# Patient Record
Sex: Female | Born: 1980 | Race: Black or African American | Hispanic: No | Marital: Single | Smoking: Never smoker
Health system: Southern US, Community
[De-identification: ages and names within clinical notes are randomized; demographics above are authoritative.]

## PROBLEM LIST (undated history)

## (undated) ENCOUNTER — Inpatient Hospital Stay (HOSPITAL_COMMUNITY): Payer: Self-pay

## (undated) DIAGNOSIS — D219 Benign neoplasm of connective and other soft tissue, unspecified: Secondary | ICD-10-CM

## (undated) DIAGNOSIS — K589 Irritable bowel syndrome without diarrhea: Secondary | ICD-10-CM

## (undated) DIAGNOSIS — A749 Chlamydial infection, unspecified: Secondary | ICD-10-CM

## (undated) DIAGNOSIS — D649 Anemia, unspecified: Secondary | ICD-10-CM

## (undated) DIAGNOSIS — O24419 Gestational diabetes mellitus in pregnancy, unspecified control: Secondary | ICD-10-CM

---
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
## 2006-12-20 ENCOUNTER — Ambulatory Visit: Payer: Self-pay | Admitting: Internal Medicine

## 2006-12-20 LAB — CONVERTED CEMR LAB
AST: 22 units/L (ref 0–37)
Albumin: 4.1 g/dL (ref 3.5–5.2)
Alkaline Phosphatase: 47 units/L (ref 39–117)
Basophils Absolute: 0 10*3/uL (ref 0.0–0.1)
Basophils Relative: 0.6 % (ref 0.0–1.0)
Bilirubin, Direct: 0.1 mg/dL (ref 0.0–0.3)
Calcium: 9.3 mg/dL (ref 8.4–10.5)
Chloride: 103 meq/L (ref 96–112)
Eosinophils Absolute: 0.1 10*3/uL (ref 0.0–0.6)
GFR calc Af Amer: 113 mL/min
GFR calc non Af Amer: 94 mL/min
HCT: 35.8 % — ABNORMAL LOW (ref 36.0–46.0)
Lymphocytes Relative: 31.3 % (ref 12.0–46.0)
MCHC: 34.1 g/dL (ref 30.0–36.0)
Monocytes Absolute: 0.2 10*3/uL (ref 0.2–0.7)
Monocytes Relative: 3.1 % (ref 3.0–11.0)
Neutro Abs: 4.9 10*3/uL (ref 1.4–7.7)
Neutrophils Relative %: 64.3 % (ref 43.0–77.0)
Platelets: 417 10*3/uL — ABNORMAL HIGH (ref 150–400)
RDW: 12.8 % (ref 11.5–14.6)
Sodium: 140 meq/L (ref 135–145)

## 2007-01-02 ENCOUNTER — Ambulatory Visit: Payer: Self-pay | Admitting: Gastroenterology

## 2007-01-10 ENCOUNTER — Ambulatory Visit (HOSPITAL_COMMUNITY): Admission: RE | Admit: 2007-01-10 | Discharge: 2007-01-10 | Payer: Self-pay | Admitting: Gastroenterology

## 2007-01-15 ENCOUNTER — Ambulatory Visit: Payer: Self-pay | Admitting: Gastroenterology

## 2007-01-29 ENCOUNTER — Ambulatory Visit: Payer: Self-pay | Admitting: Gastroenterology

## 2007-06-25 ENCOUNTER — Ambulatory Visit: Payer: Self-pay | Admitting: Gastroenterology

## 2007-06-25 LAB — CONVERTED CEMR LAB
Albumin: 3.8 g/dL (ref 3.5–5.2)
Alkaline Phosphatase: 41 units/L (ref 39–117)
Basophils Absolute: 0 10*3/uL (ref 0.0–0.1)
Basophils Relative: 0.2 % (ref 0.0–1.0)
Bilirubin, Direct: 0.1 mg/dL (ref 0.0–0.3)
CO2: 31 meq/L (ref 19–32)
Calcium: 8.9 mg/dL (ref 8.4–10.5)
Chloride: 106 meq/L (ref 96–112)
Eosinophils Absolute: 0 10*3/uL (ref 0.0–0.6)
Eosinophils Relative: 0.5 % (ref 0.0–5.0)
GFR calc Af Amer: 98 mL/min
Glucose, Bld: 123 mg/dL — ABNORMAL HIGH (ref 70–99)
MCV: 80.5 fL (ref 78.0–100.0)
Neutrophils Relative %: 65.1 % (ref 43.0–77.0)
RDW: 12.8 % (ref 11.5–14.6)
Sodium: 141 meq/L (ref 135–145)
Total Bilirubin: 0.7 mg/dL (ref 0.3–1.2)
Total Protein: 6.9 g/dL (ref 6.0–8.3)

## 2007-06-26 ENCOUNTER — Ambulatory Visit: Payer: Self-pay | Admitting: Gastroenterology

## 2007-07-09 ENCOUNTER — Encounter: Payer: Self-pay | Admitting: Internal Medicine

## 2007-07-09 ENCOUNTER — Ambulatory Visit: Payer: Self-pay | Admitting: Gastroenterology

## 2007-07-10 ENCOUNTER — Ambulatory Visit (HOSPITAL_COMMUNITY): Admission: RE | Admit: 2007-07-10 | Discharge: 2007-07-10 | Payer: Self-pay | Admitting: Gastroenterology

## 2007-10-13 ENCOUNTER — Inpatient Hospital Stay (HOSPITAL_COMMUNITY): Admission: AD | Admit: 2007-10-13 | Discharge: 2007-10-13 | Payer: Self-pay | Admitting: Obstetrics & Gynecology

## 2007-10-16 ENCOUNTER — Inpatient Hospital Stay (HOSPITAL_COMMUNITY): Admission: AD | Admit: 2007-10-16 | Discharge: 2007-10-16 | Payer: Self-pay | Admitting: Obstetrics & Gynecology

## 2007-10-18 ENCOUNTER — Ambulatory Visit: Payer: Self-pay | Admitting: Internal Medicine

## 2007-11-07 DIAGNOSIS — A749 Chlamydial infection, unspecified: Secondary | ICD-10-CM

## 2007-11-07 HISTORY — DX: Chlamydial infection, unspecified: A74.9

## 2007-11-13 ENCOUNTER — Encounter: Payer: Self-pay | Admitting: Internal Medicine

## 2007-11-15 ENCOUNTER — Ambulatory Visit: Payer: Self-pay | Admitting: Obstetrics & Gynecology

## 2007-11-15 ENCOUNTER — Encounter: Payer: Self-pay | Admitting: Obstetrics and Gynecology

## 2007-12-13 ENCOUNTER — Encounter: Payer: Self-pay | Admitting: Internal Medicine

## 2008-02-05 ENCOUNTER — Telehealth (INDEPENDENT_AMBULATORY_CARE_PROVIDER_SITE_OTHER): Payer: Self-pay | Admitting: *Deleted

## 2008-02-05 ENCOUNTER — Ambulatory Visit: Payer: Self-pay | Admitting: Internal Medicine

## 2008-02-05 DIAGNOSIS — R109 Unspecified abdominal pain: Secondary | ICD-10-CM | POA: Insufficient documentation

## 2008-02-05 LAB — CONVERTED CEMR LAB
Blood in Urine, dipstick: NEGATIVE
Glucose, Urine, Semiquant: NEGATIVE
Nitrite: NEGATIVE

## 2008-02-21 ENCOUNTER — Ambulatory Visit: Payer: Self-pay | Admitting: Internal Medicine

## 2008-03-06 ENCOUNTER — Telehealth (INDEPENDENT_AMBULATORY_CARE_PROVIDER_SITE_OTHER): Payer: Self-pay | Admitting: *Deleted

## 2008-03-06 DIAGNOSIS — A318 Other mycobacterial infections: Secondary | ICD-10-CM | POA: Insufficient documentation

## 2008-03-17 ENCOUNTER — Ambulatory Visit: Payer: Self-pay | Admitting: Oncology

## 2008-03-24 ENCOUNTER — Telehealth (INDEPENDENT_AMBULATORY_CARE_PROVIDER_SITE_OTHER): Payer: Self-pay | Admitting: *Deleted

## 2008-04-03 ENCOUNTER — Encounter: Payer: Self-pay | Admitting: Internal Medicine

## 2008-04-03 LAB — COMPREHENSIVE METABOLIC PANEL
AST: 17 U/L (ref 0–37)
Albumin: 4.5 g/dL (ref 3.5–5.2)
Alkaline Phosphatase: 56 U/L (ref 39–117)
BUN: 6 mg/dL (ref 6–23)
Creatinine, Ser: 0.91 mg/dL (ref 0.40–1.20)
Potassium: 4.1 mEq/L (ref 3.5–5.3)
Total Bilirubin: 0.3 mg/dL (ref 0.3–1.2)

## 2008-04-03 LAB — CBC WITH DIFFERENTIAL/PLATELET
EOS%: 0.7 % (ref 0.0–7.0)
Eosinophils Absolute: 0.1 10*3/uL (ref 0.0–0.5)
MCH: 26.9 pg (ref 26.0–34.0)
MCV: 78.8 fL — ABNORMAL LOW (ref 81.0–101.0)
MONO%: 6.2 % (ref 0.0–13.0)
NEUT#: 5.1 10*3/uL (ref 1.5–6.5)
RBC: 4.63 10*6/uL (ref 3.70–5.32)
RDW: 13.9 % (ref 11.3–14.5)

## 2008-04-03 LAB — CHCC SMEAR

## 2008-04-14 ENCOUNTER — Encounter: Payer: Self-pay | Admitting: Internal Medicine

## 2008-06-30 ENCOUNTER — Telehealth (INDEPENDENT_AMBULATORY_CARE_PROVIDER_SITE_OTHER): Payer: Self-pay | Admitting: *Deleted

## 2008-07-31 ENCOUNTER — Ambulatory Visit: Payer: Self-pay | Admitting: Oncology

## 2008-08-31 ENCOUNTER — Emergency Department (HOSPITAL_BASED_OUTPATIENT_CLINIC_OR_DEPARTMENT_OTHER): Admission: EM | Admit: 2008-08-31 | Discharge: 2008-08-31 | Payer: Self-pay | Admitting: Emergency Medicine

## 2008-09-03 ENCOUNTER — Emergency Department (HOSPITAL_BASED_OUTPATIENT_CLINIC_OR_DEPARTMENT_OTHER): Admission: EM | Admit: 2008-09-03 | Discharge: 2008-09-03 | Payer: Self-pay | Admitting: Emergency Medicine

## 2008-09-08 IMAGING — US US ABDOMEN COMPLETE
1 series · 14 of 25 positions shown · non-contrast
Comparison: None

CLINICAL DATA: Right upper quadrant abdominal pain.

ABDOMEN ULTRASOUND
TECHNIQUE: Complete abdominal ultrasound examination was performed including
evaluation of the liver, gallbladder, bile ducts, pancreas, kidneys, spleen,
IVC, and abdominal aorta.

[Series 1: unknown · 0.33mm/px · 14 of 57 slices shown]
[im 1/57]
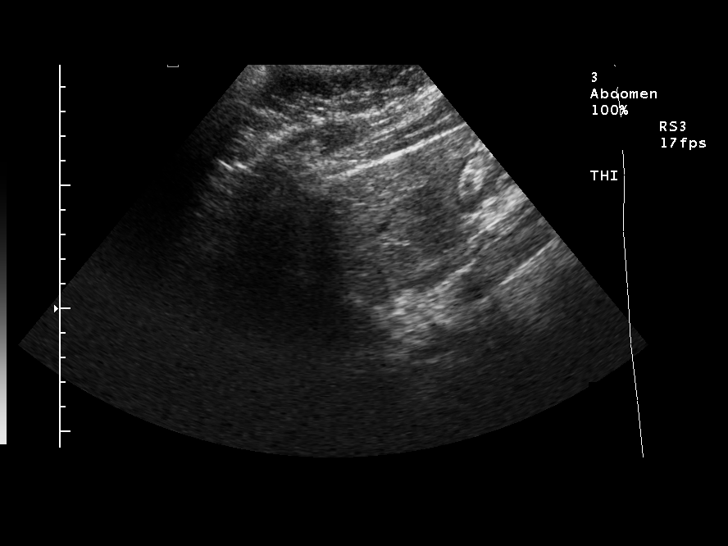
[im 5/57]
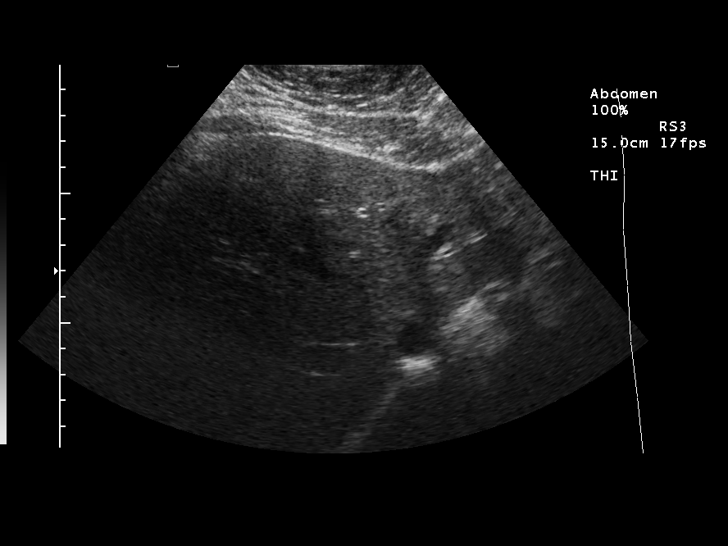
[im 10/57]
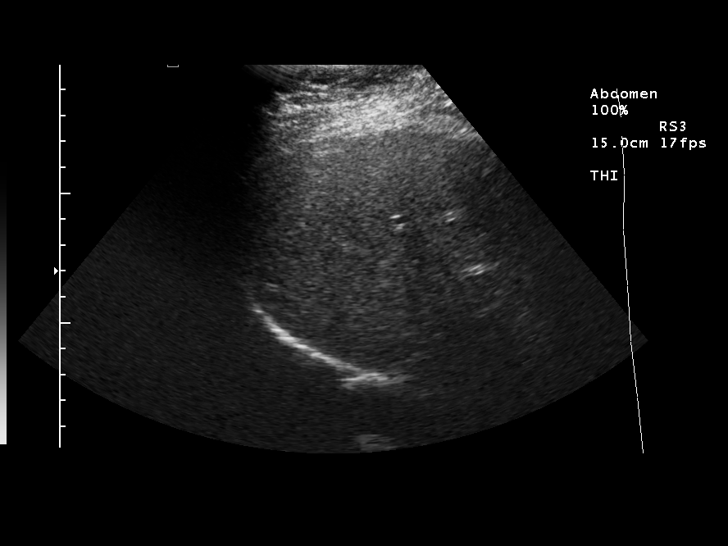
[im 15/57]
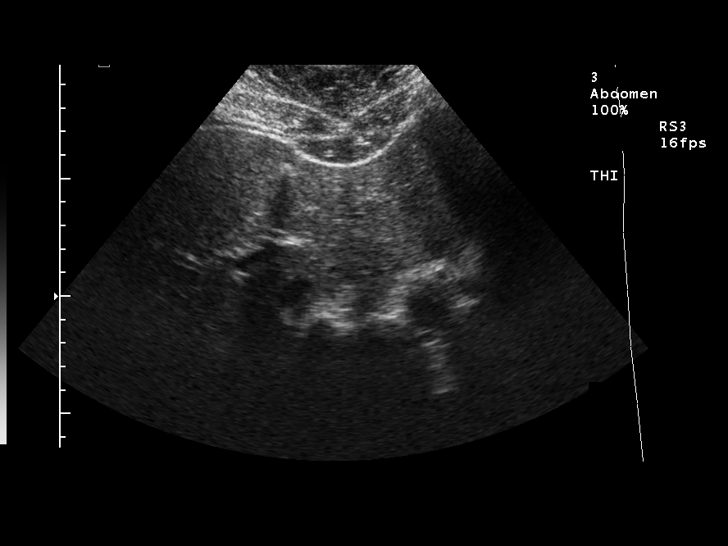
[im 19/57]
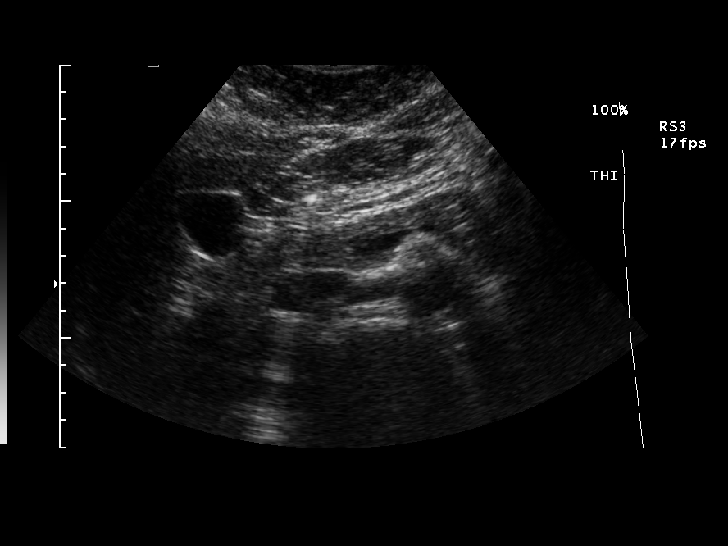
[im 22/57]
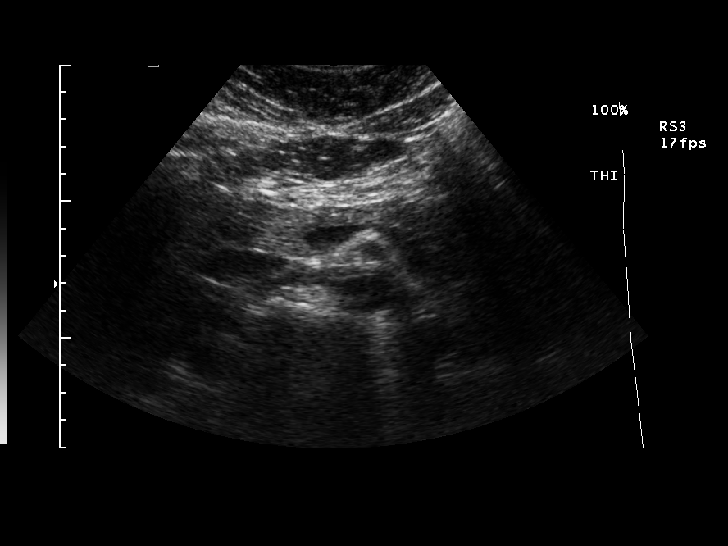
[im 26/57]
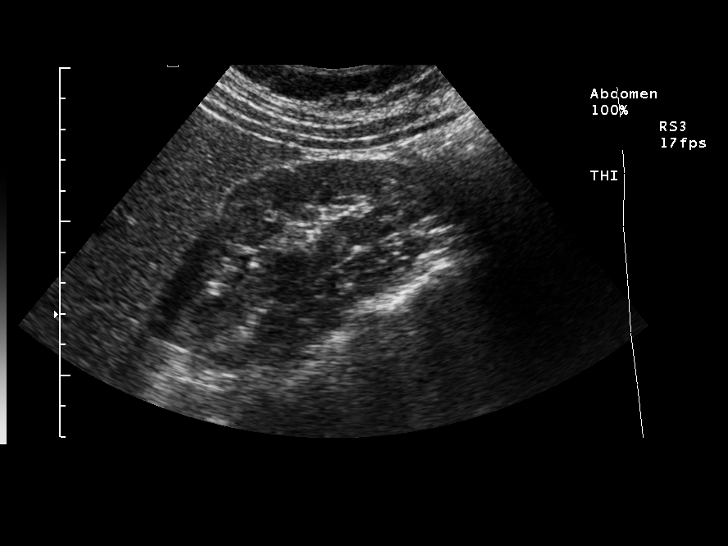
[im 31/57]
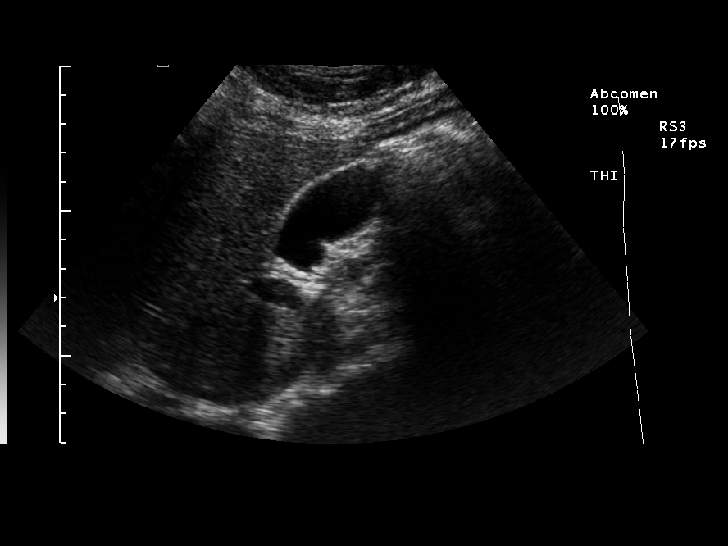
[im 36/57]
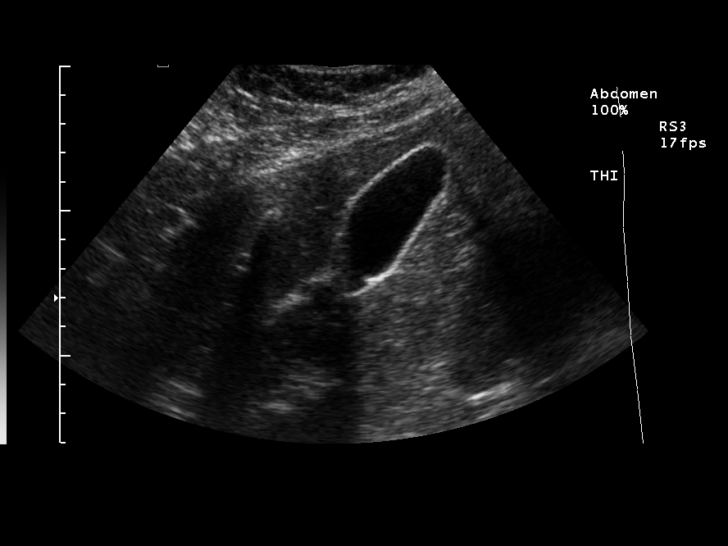
[im 38/57]
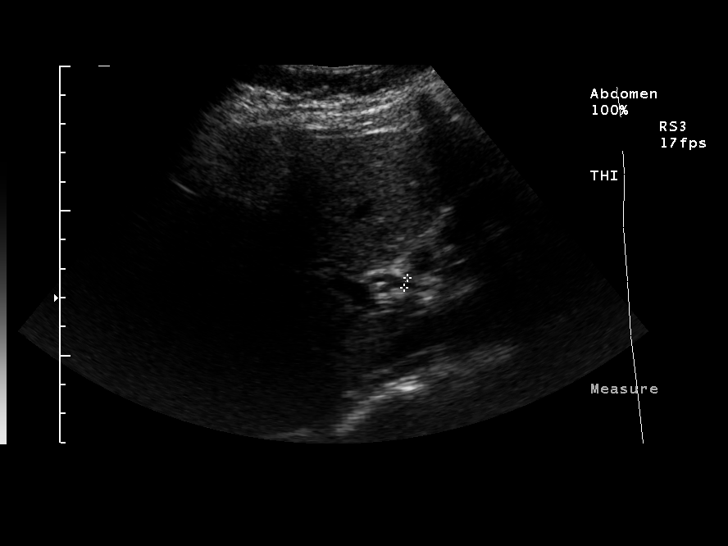
[im 43/57]
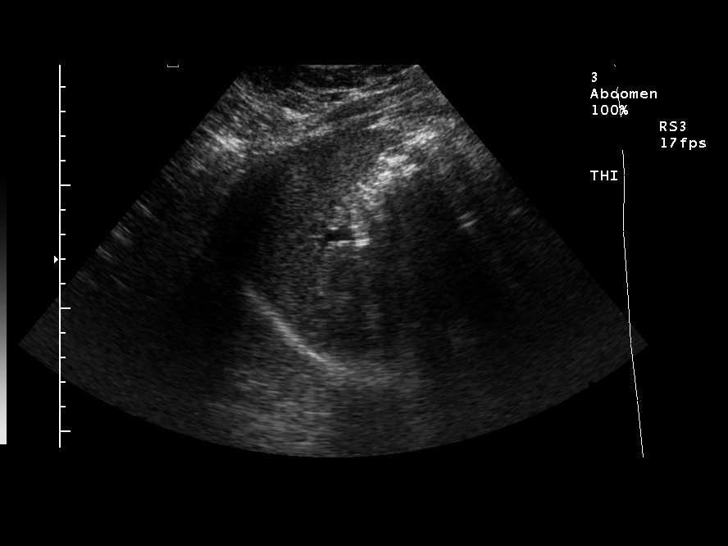
[im 47/57]
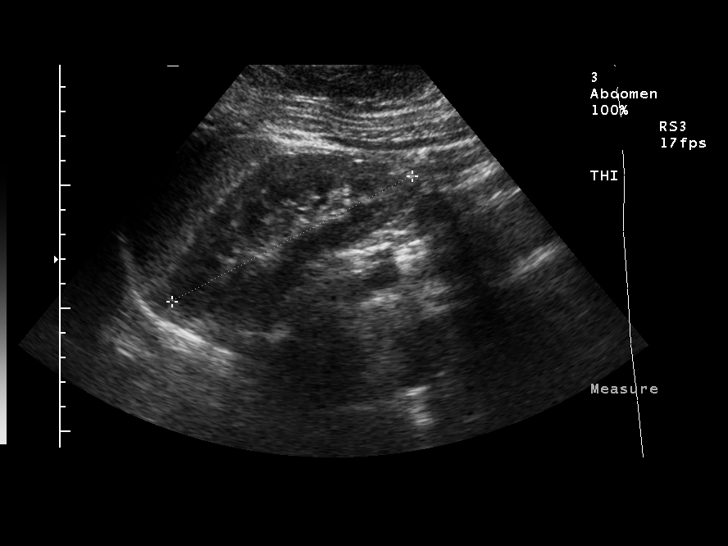
[im 52/57]
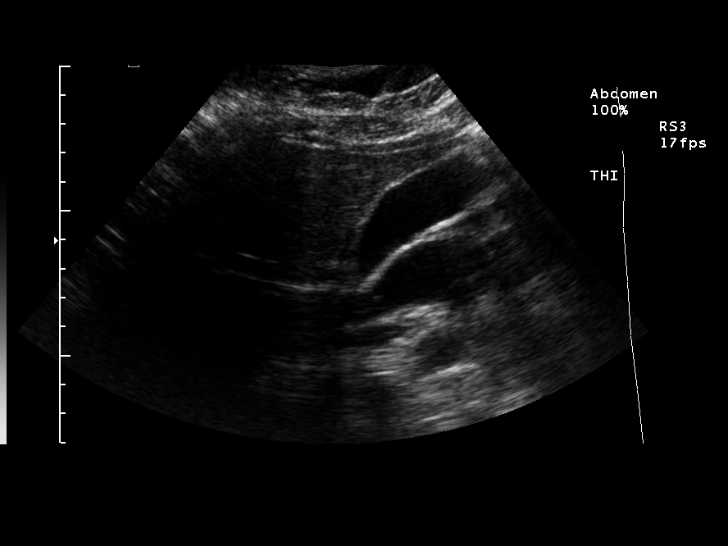
[im 57/57]
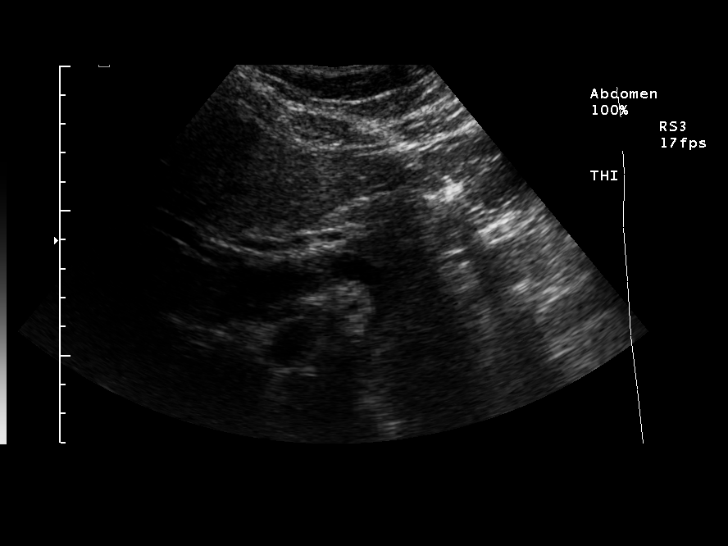

[14 of 25 positions shown; findings below may reference images not displayed]

FINDINGS: Gallbladder normal, with 1.4 millimeters wall thickness. No
sonographic Murphy's sign, stones, sludge, or pericholecystic fluid. Common bile
duct 3.6 mm, normal. Liver appears normal, with normal echotexture and no focal
masses. Portal and hepatic veins appear unremarkable. IVC within normal limits.
Visualized abdominal aorta unremarkable with a maximal diameter of 1.3 cm in the
upper abdomen. Normal aortic tapering. Visualized portions of head, neck, and
body of the pancreas normal. Right kidney 10.6 cm, normal. Left kidney 11 cm,
normal. Splenic span is 9.4 cm, normal.
*******************************
IMPRESSION
Negative abdominal ultrasound.
*******************************

## 2008-12-12 IMAGING — US US OB COMP LESS 14 WK
1 series · 14 of 28 positions shown · non-contrast
Comparison: None

CLINICAL DATA: Six weeks one day pregnant with stomach pain. Abdominal pain.

OBSTETRICAL ULTRASOUND <14 WK TRANSABDOMINAL AND TRANSVAGINAL OB]:
TECHNIQUE: Both transabdominal and transvaginal ultrasound examinations were
performed for complete evaluation of the gestation as well as the maternal
uterus, adnexal regions, and pelvic cul-de-sac.

[Series 1: us ob comp less 14 wk · 0.26mm/px · 14 of 43 slices shown]
[im 2/43]
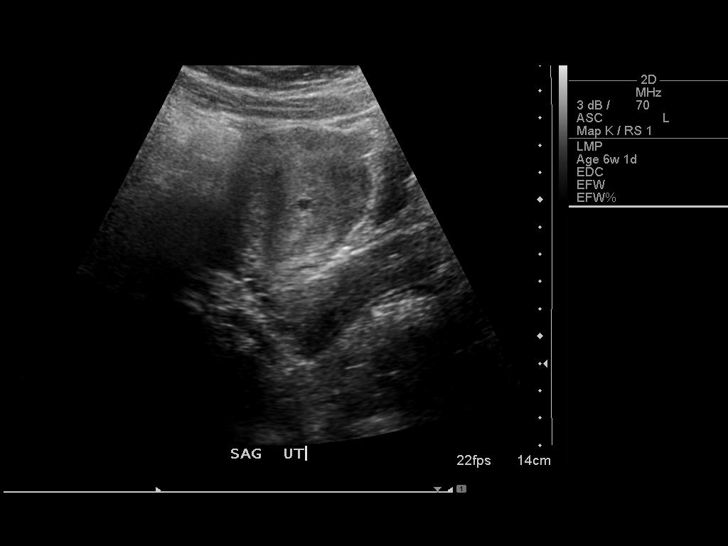
[im 5/43]
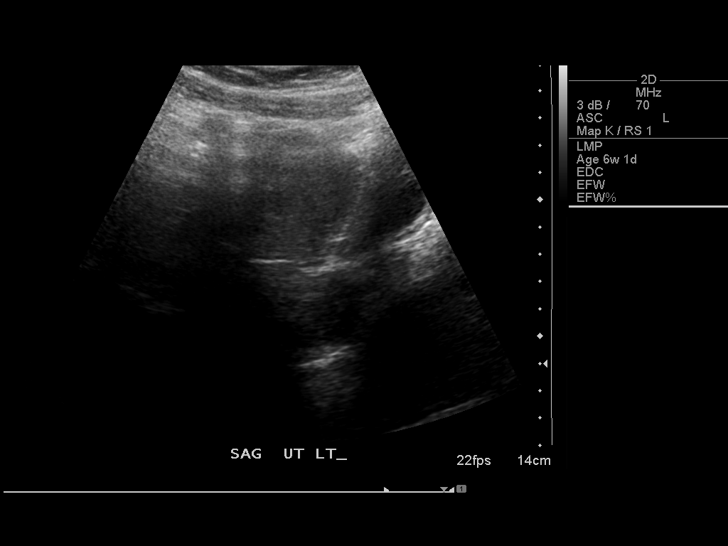
[im 8/43]
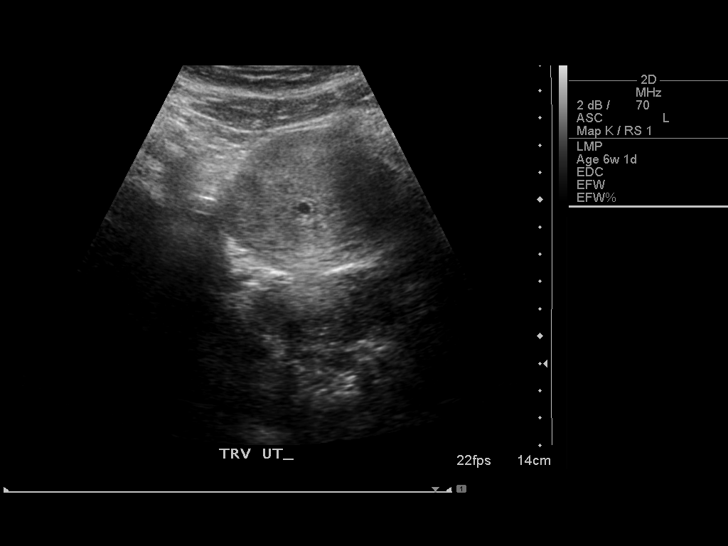
[im 11/43]
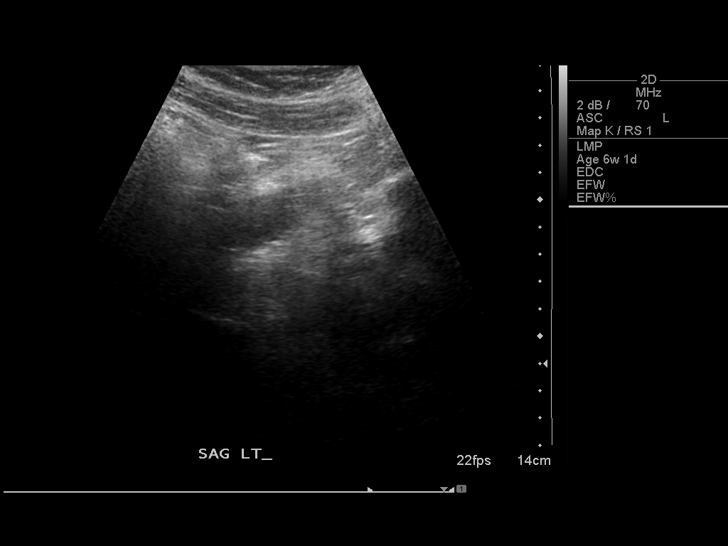
[im 15/43]
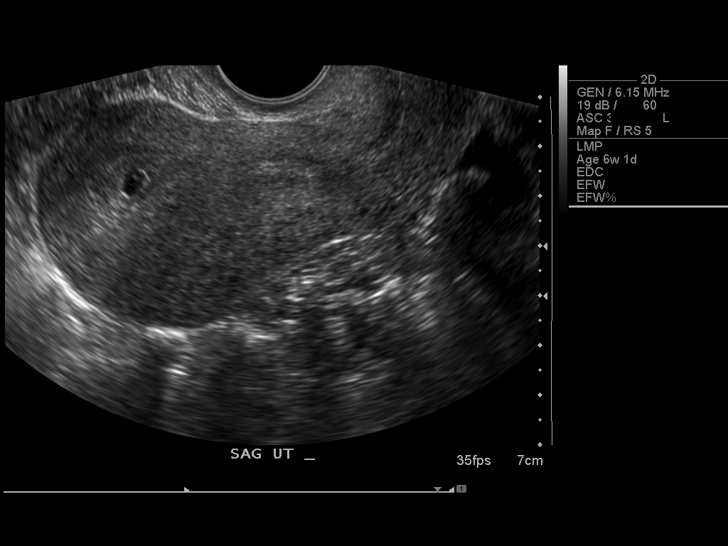
[im 18/43]
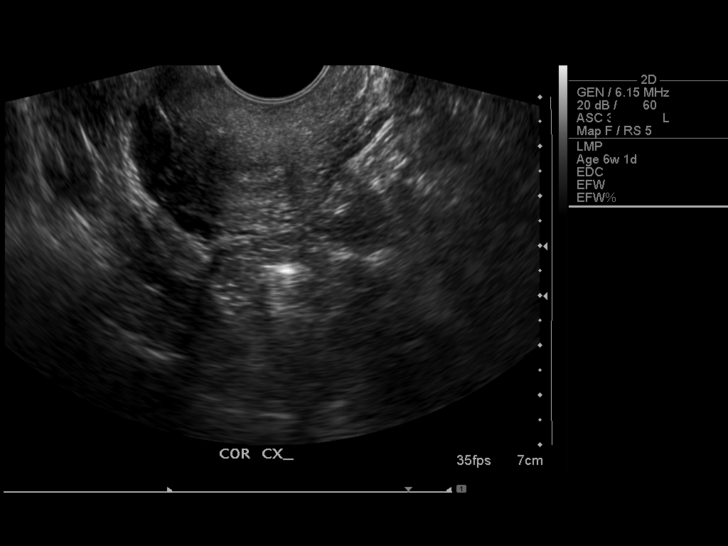
[im 21/43]
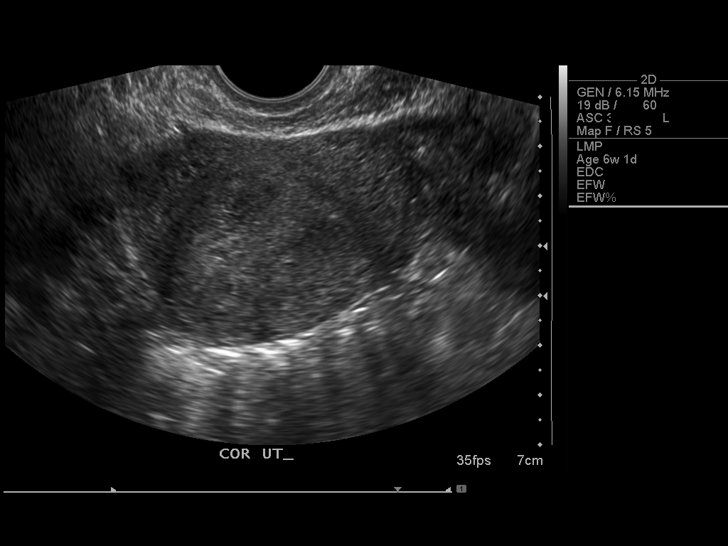
[im 24/43]
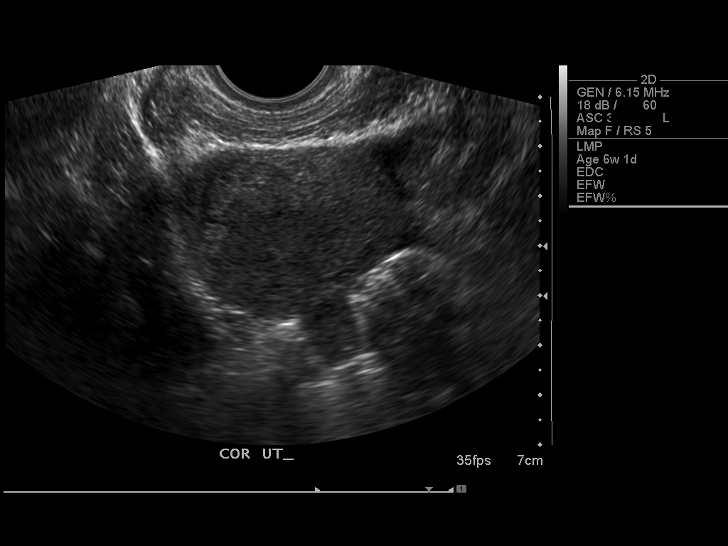
[im 27/43]
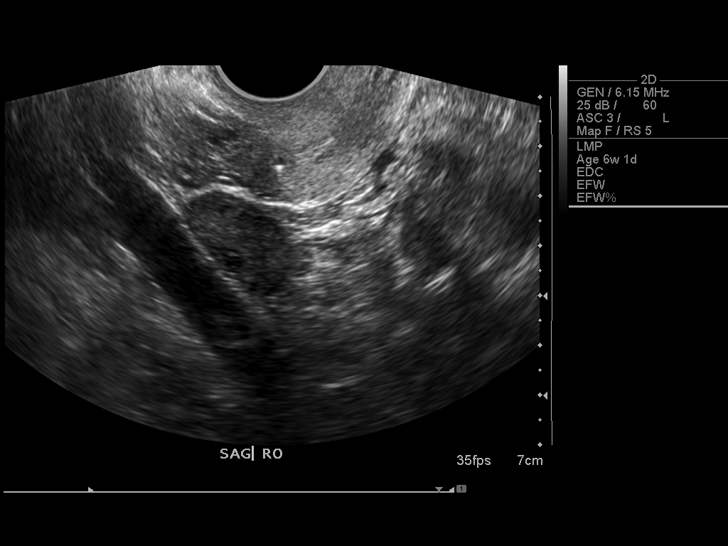
[im 30/43]
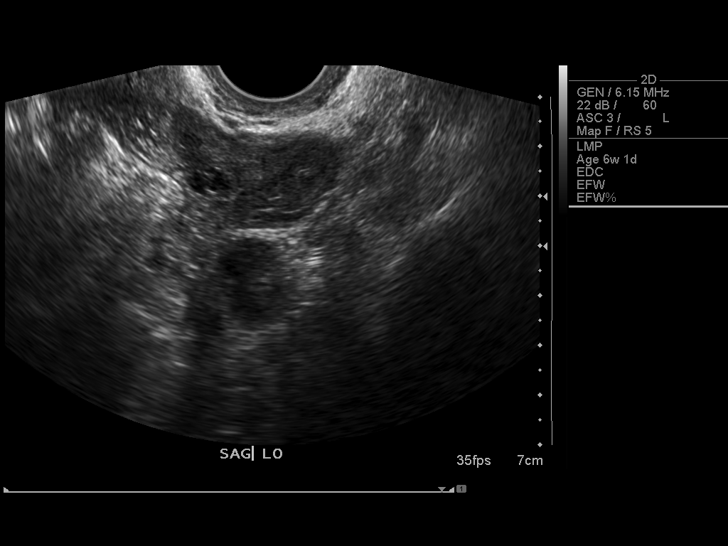
[im 33/43]
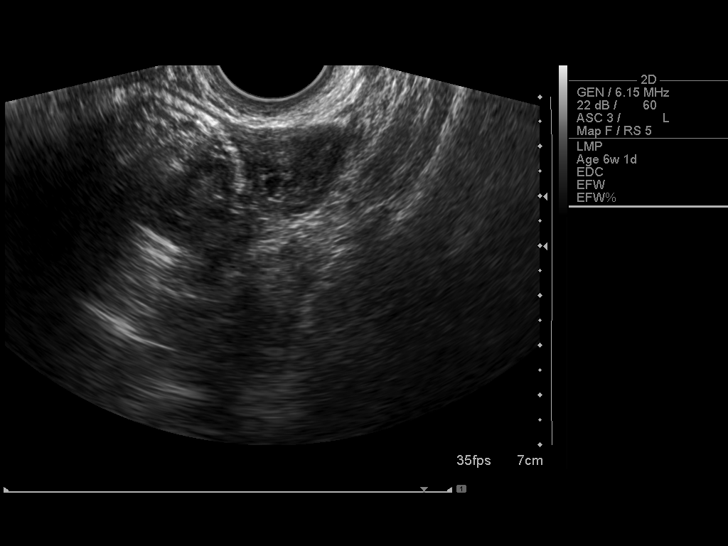
[im 36/43]
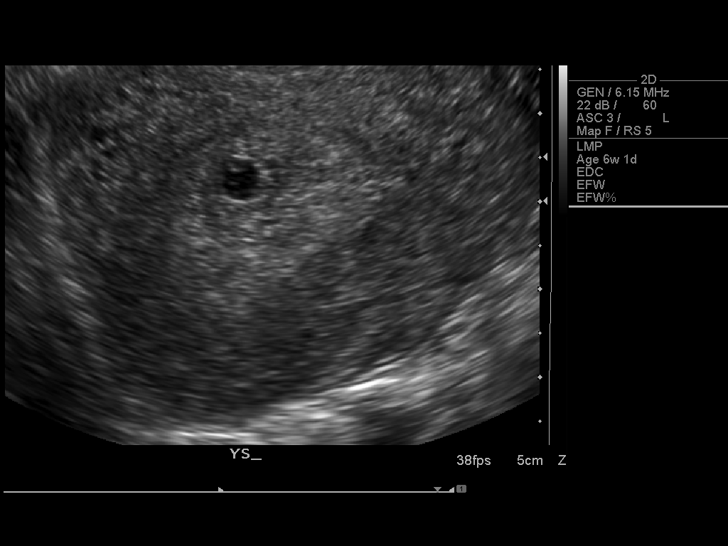
[im 39/43]
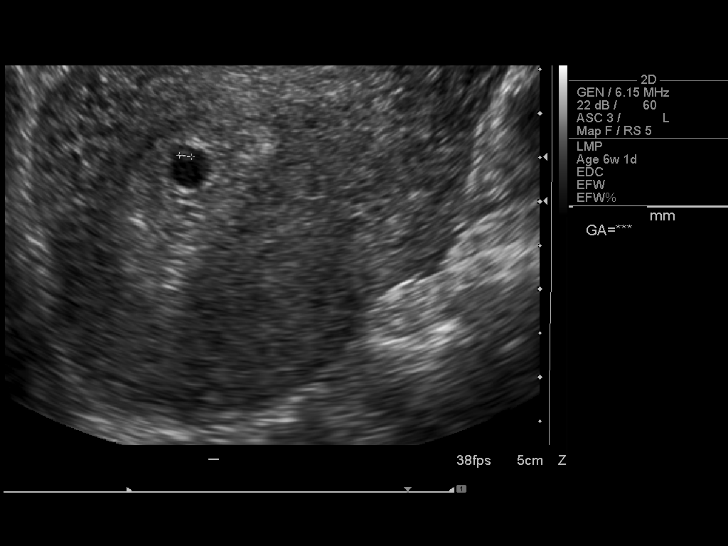
[im 43/43]
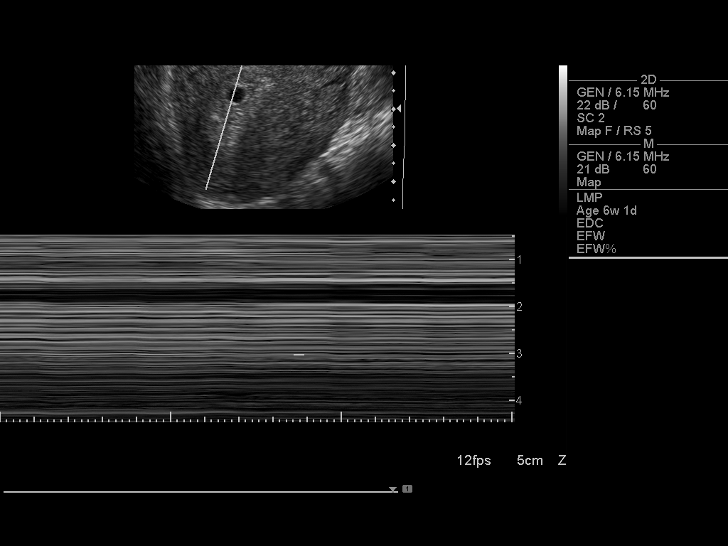

[14 of 28 positions shown; findings below may reference images not displayed]

FINDINGS: Intrauterine pregnancy with mean sac c diameter of 5 mm,
corresponding to 5 weeks 2 days. Yolk sac and 1 mm fetal pole identified. No
cardiac activity.

No subchorionic hemorrhage.

Right ovary within normal limits. Left ovary demonstrates a hypervascular focus
within of approximately 1.9 cm likely representing a corpus luteal cyst.

Trace simple appearing free fluid.
IMPRESSION: 1. Intrauterine pregnancy with gestational age of approximately 5 weeks 2 days.
 2. No evidence of subchorionic hemorrhage.
3. Left ovarian corpus luteal cyst.

## 2008-12-15 IMAGING — US US OB TRANSVAGINAL
1 series · 14 of 28 positions shown · non-contrast
Comparison: 10/13/07.

CLINICAL DATA: 6 weeks pregnant.  Continued vaginal bleeding and pelvic pain.  Threatened abortion.  
 TRANSVAGINAL OBSTETRICAL US:
TECHNIQUE: Transvaginal ultrasound was performed for evaluation of the gestation as well as the maternal uterus and adnexal regions.

[Series 1: us ob transvaginal · 14 of 29 slices shown]
[im 2/29]
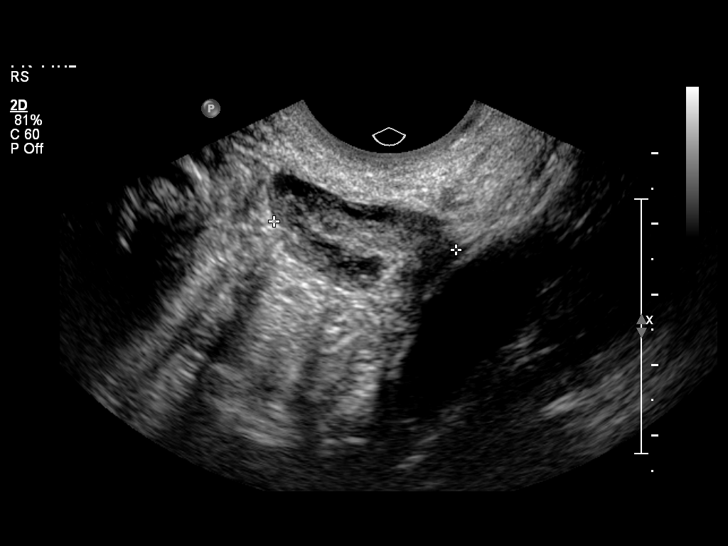
[im 4/29]
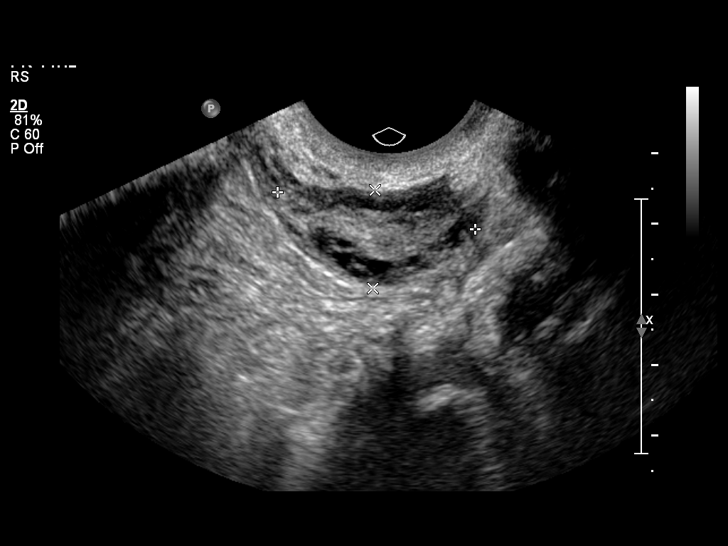
[im 6/29]
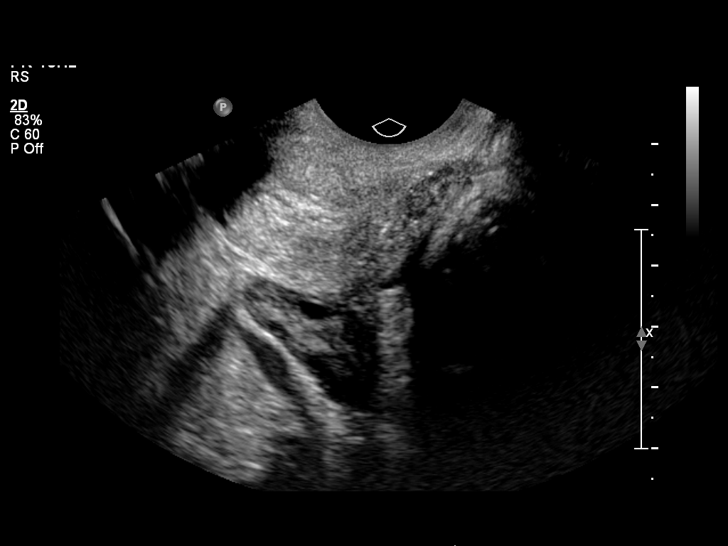
[im 8/29]
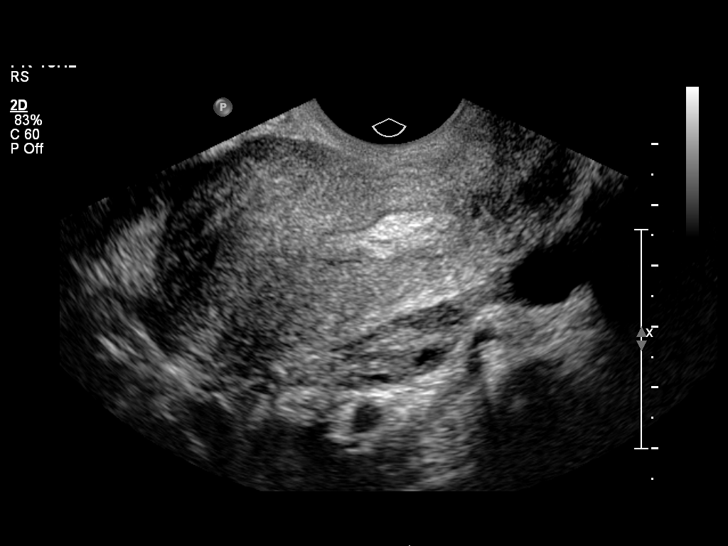
[im 10/29]
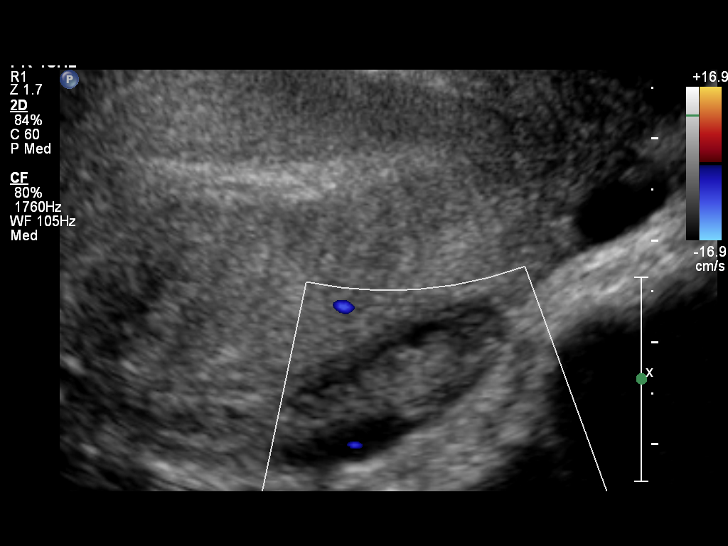
[im 12/29]
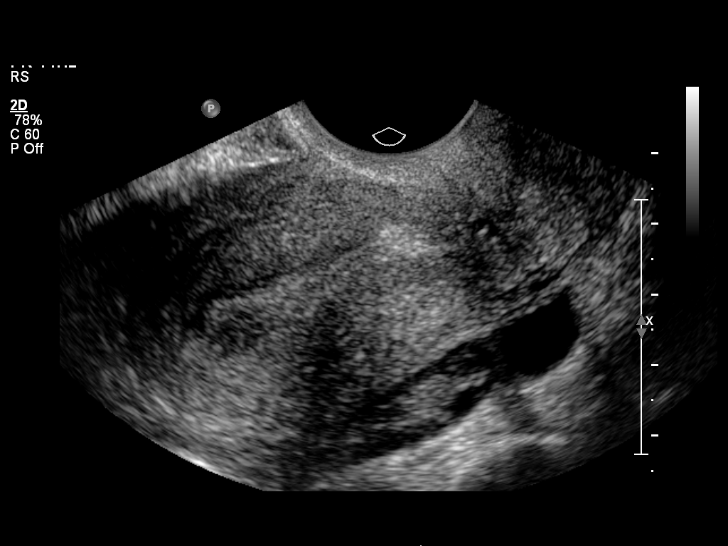
[im 14/29]
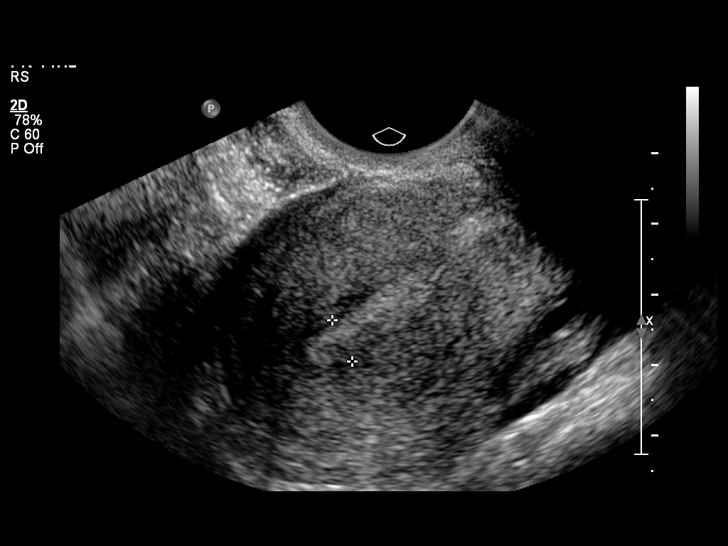
[im 16/29]
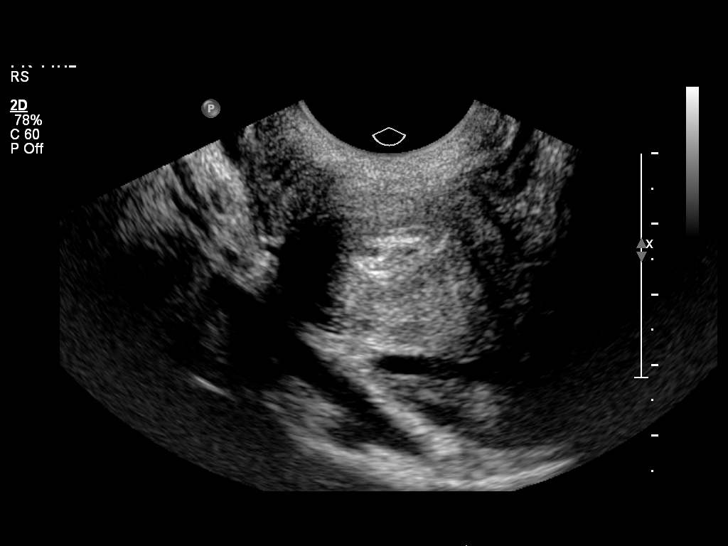
[im 18/29]
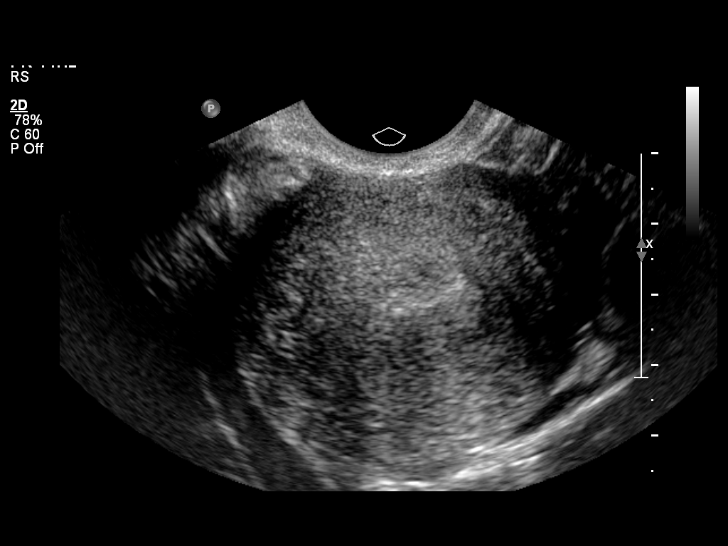
[im 20/29]
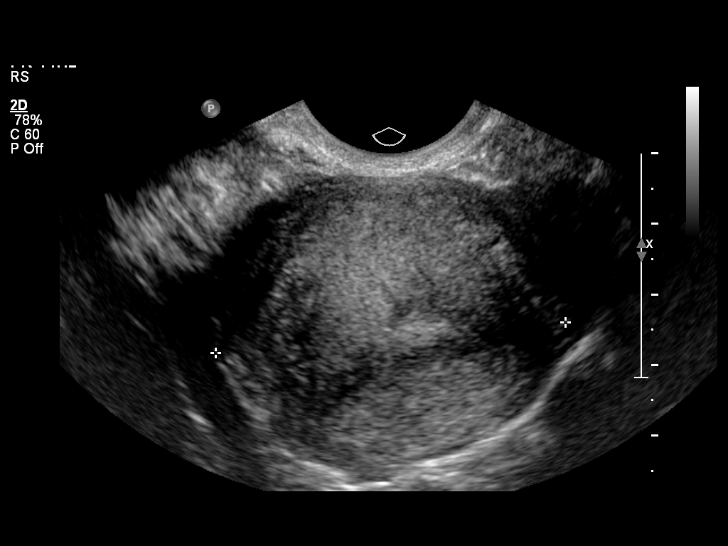
[im 22/29]
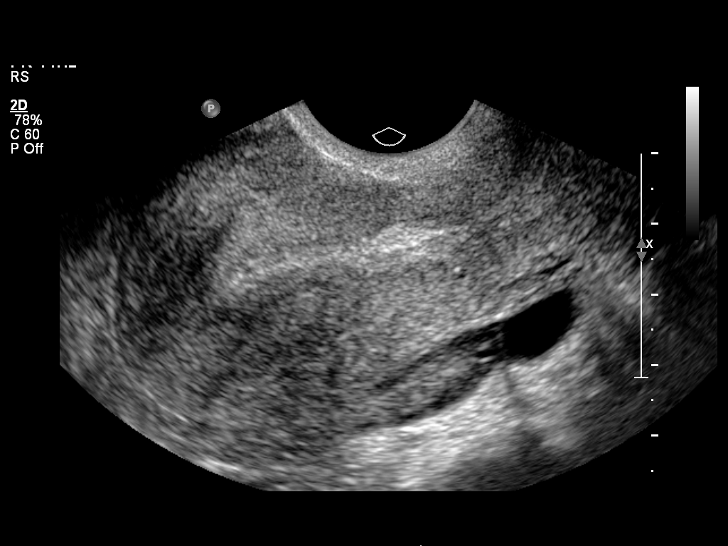
[im 24/29]
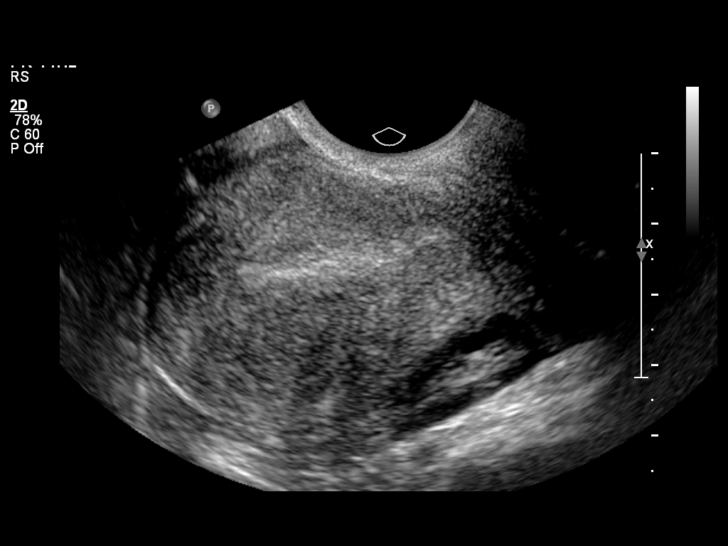
[im 26/29]
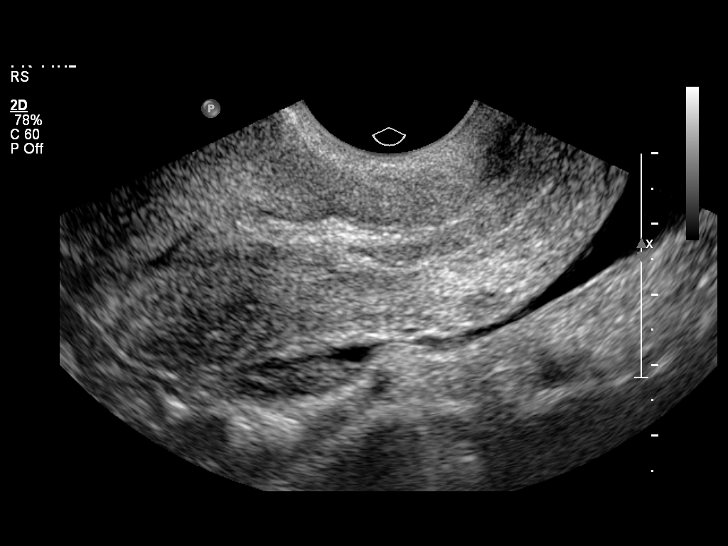
[im 29/29]
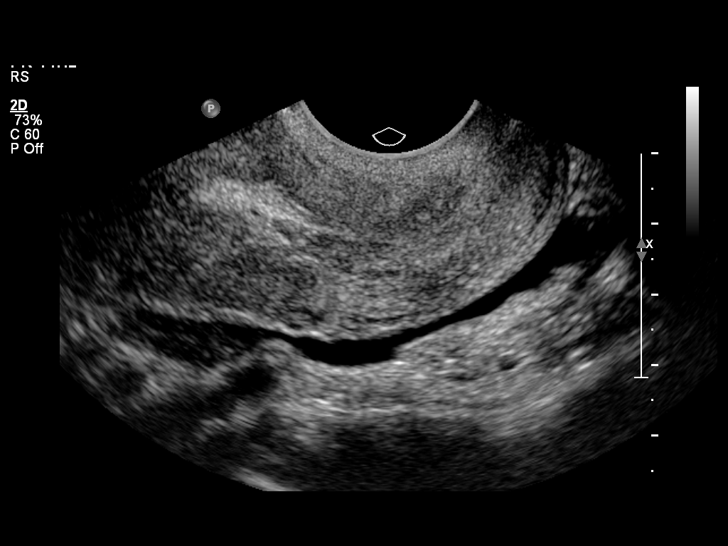

[14 of 28 positions shown; findings below may reference images not displayed]

FINDINGS: Previously seen intrauterine gestational sac is no longer present, consistent with completed spontaneous abortion.  No residual soft tissue mass or fluid collection is seen within the endometrial cavity.  No fibroids or other uterine abnormalities are identified. 
 Both ovaries are normal on appearance.  There is no evidence of adnexal mass.  Tiny amount of free fluid is seen within the pelvic cul-de-sac.
IMPRESSION: Intrauterine gestational sac no longer visualized, consistent with completed spontaneous abortion.

## 2009-03-15 ENCOUNTER — Other Ambulatory Visit: Payer: Self-pay | Admitting: Emergency Medicine

## 2009-03-15 ENCOUNTER — Inpatient Hospital Stay (HOSPITAL_COMMUNITY): Admission: AD | Admit: 2009-03-15 | Discharge: 2009-03-15 | Payer: Self-pay | Admitting: Family Medicine

## 2009-07-20 ENCOUNTER — Ambulatory Visit (HOSPITAL_COMMUNITY): Admission: RE | Admit: 2009-07-20 | Discharge: 2009-07-20 | Payer: Self-pay | Admitting: Obstetrics and Gynecology

## 2009-11-07 ENCOUNTER — Inpatient Hospital Stay (HOSPITAL_COMMUNITY): Admission: AD | Admit: 2009-11-07 | Discharge: 2009-11-09 | Payer: Self-pay | Admitting: Internal Medicine

## 2009-11-08 ENCOUNTER — Encounter: Payer: Self-pay | Admitting: Obstetrics and Gynecology

## 2010-05-15 IMAGING — US US OB COMP LESS 14 WK
1 series · 14 of 28 positions shown · non-contrast
Comparison: none

OBSTETRICAL ULTRASOUND:
 This ultrasound exam was performed in the [HOSPITAL] Ultrasound Department.  The OB US report was generated in the AS system, and faxed to the ordering physician.  This report is also available in [REDACTED] PACS.

[Series 1: us ob comp less 14 wks · 37 acquisitions, 14 frames shown]
[im 2/37]
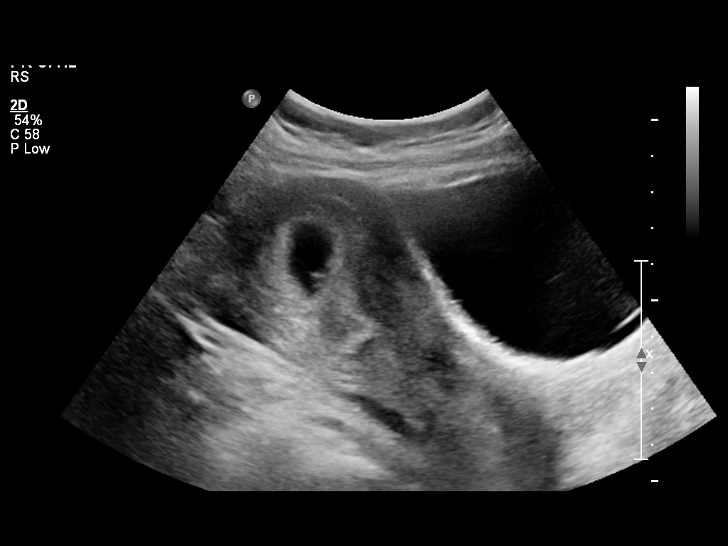
[im 5/37]
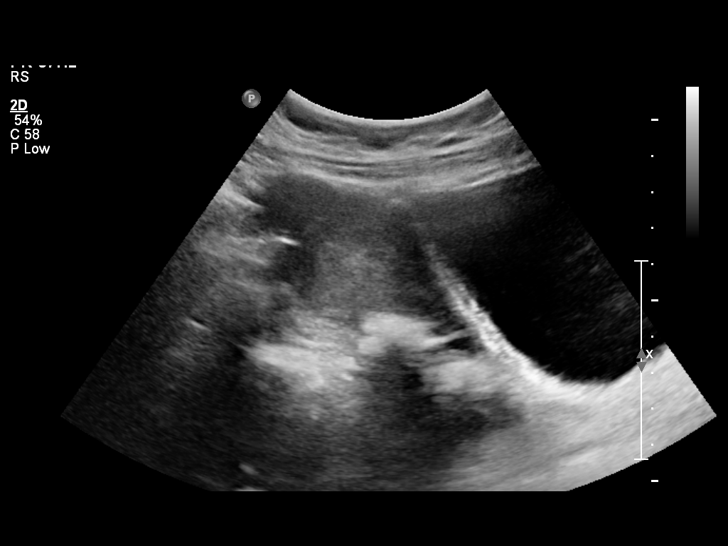
[im 7/37]
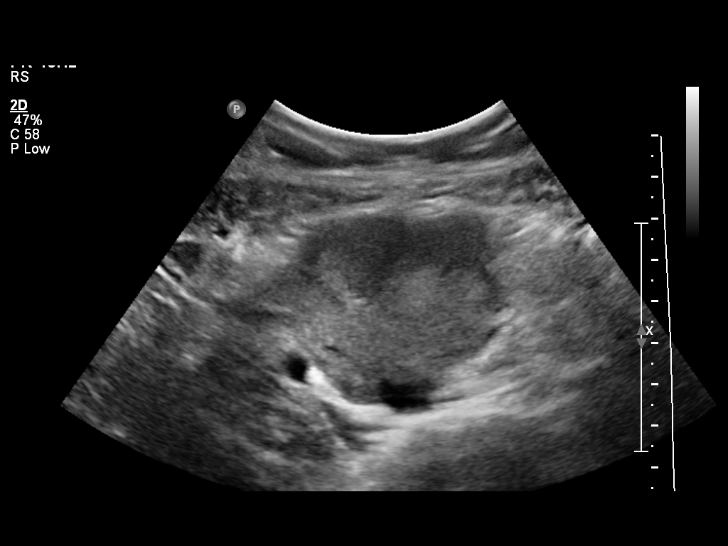
[im 10/37]
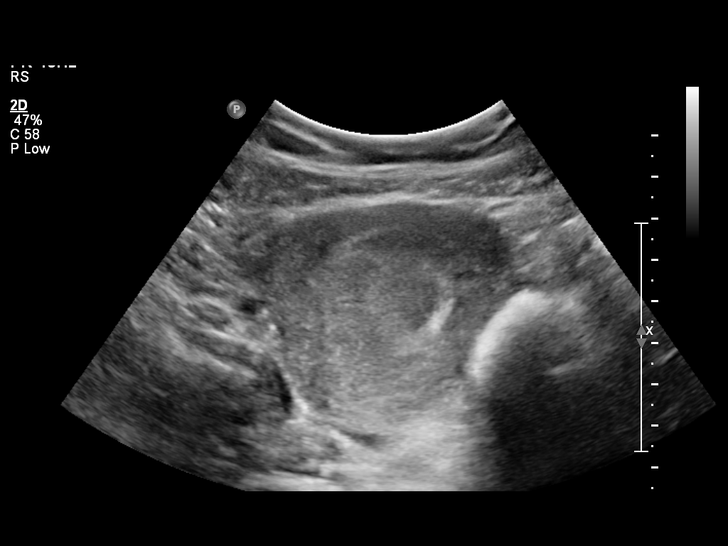
[im 13/37]
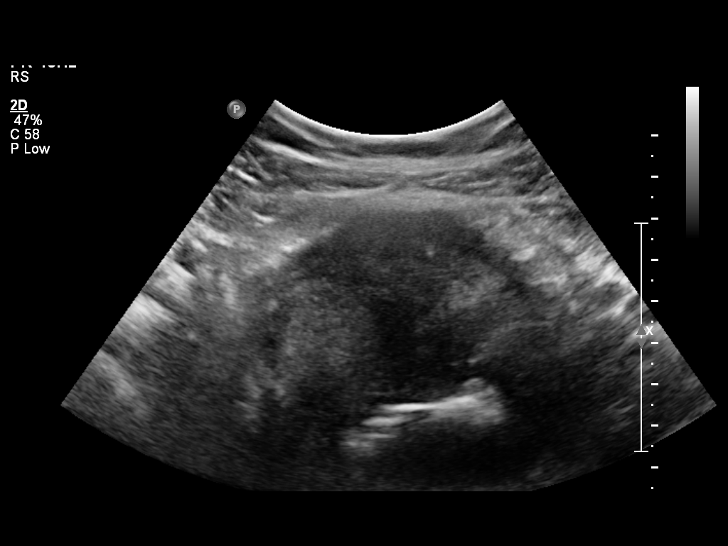
[im 15/37]
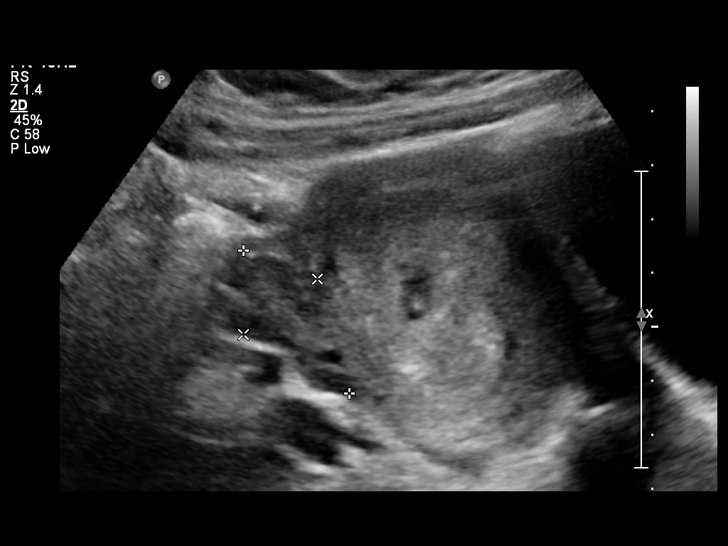
[im 18/37]
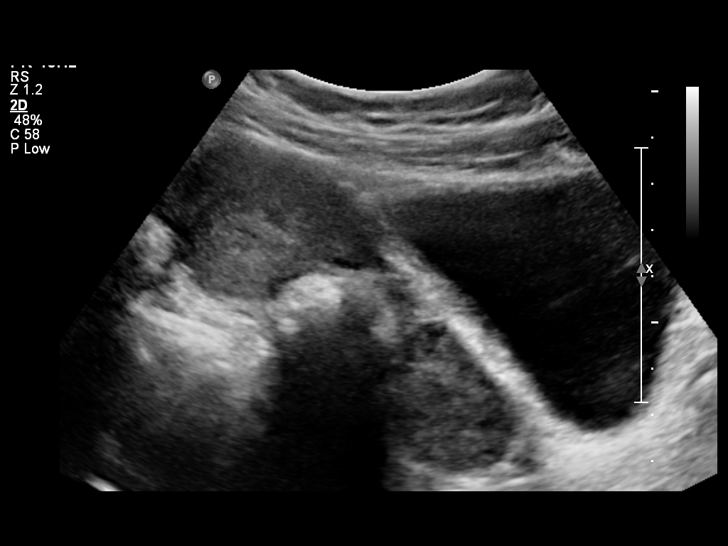
[im 21/37]
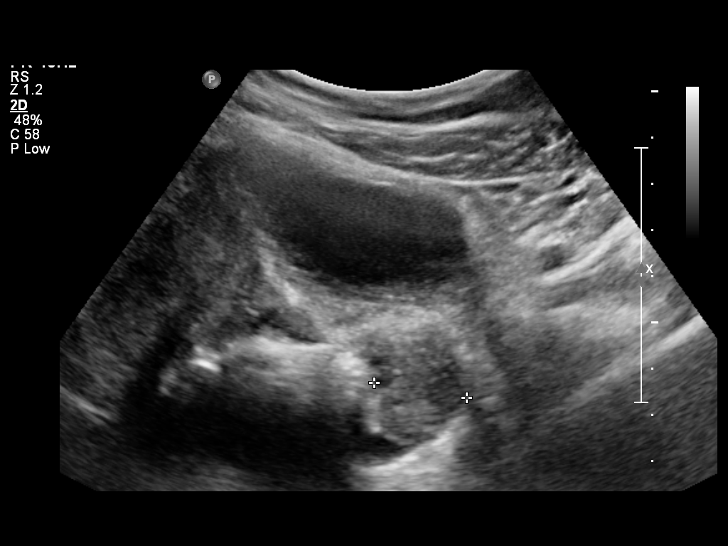
[im 23/37]
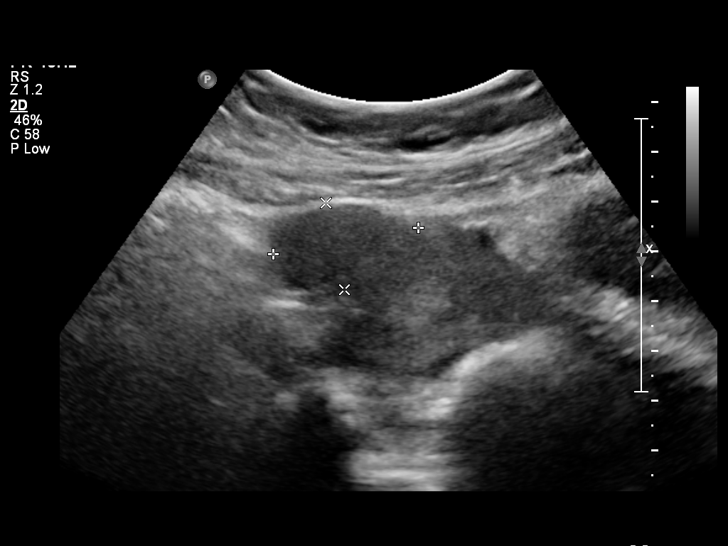
[im 26/37]
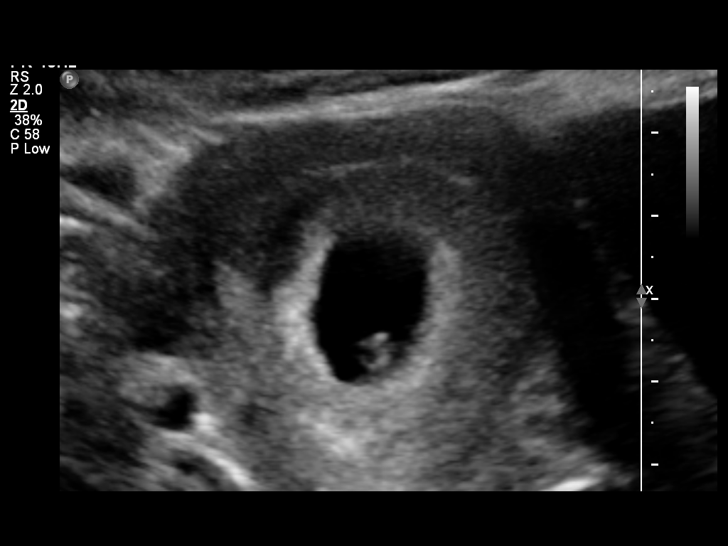
[im 29/37]
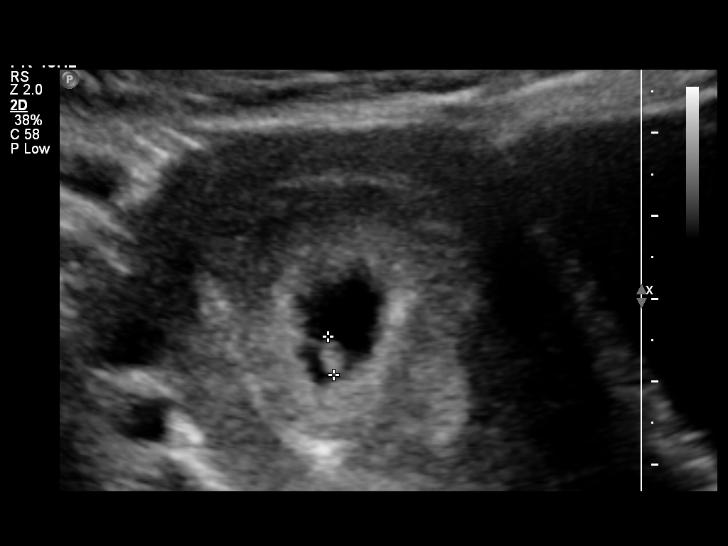
[im 31/37]
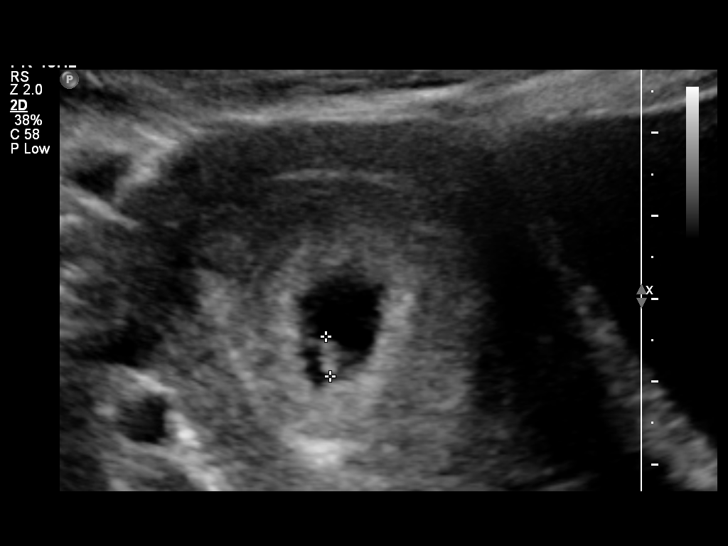
[im 34/37]
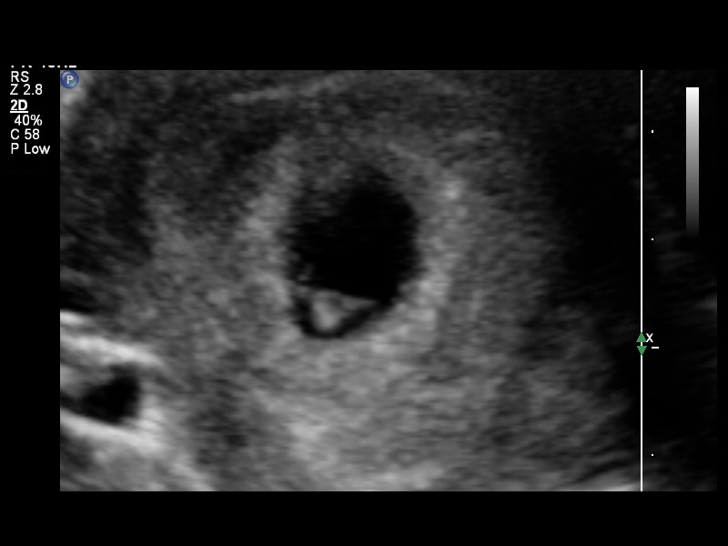
[im 37/37]
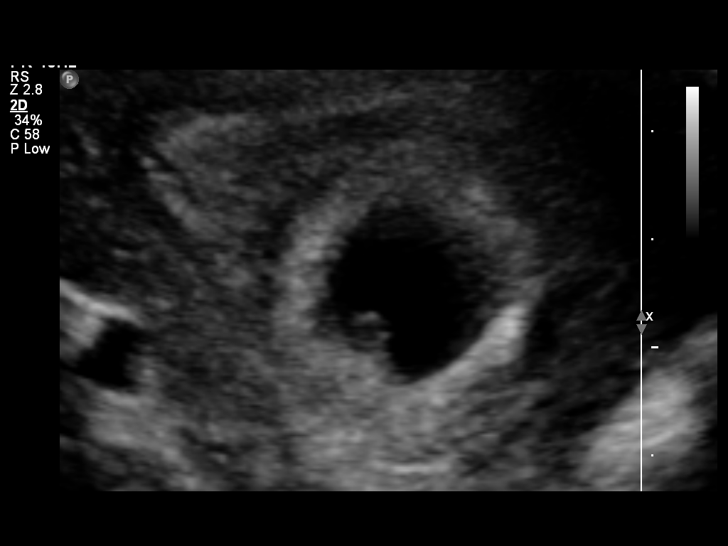

[14 of 28 positions shown; findings below may reference images not displayed]

IMPRESSION: See AS Obstetric US report.

## 2011-01-22 LAB — CBC
HCT: 28.2 % — ABNORMAL LOW (ref 36.0–46.0)
HCT: 28.4 % — ABNORMAL LOW (ref 36.0–46.0)
HCT: 36.7 % (ref 36.0–46.0)
Hemoglobin: 12.3 g/dL (ref 12.0–15.0)
Hemoglobin: 9.3 g/dL — ABNORMAL LOW (ref 12.0–15.0)
Hemoglobin: 9.5 g/dL — ABNORMAL LOW (ref 12.0–15.0)
MCHC: 32.8 g/dL (ref 30.0–36.0)
MCHC: 33.4 g/dL (ref 30.0–36.0)
MCHC: 33.5 g/dL (ref 30.0–36.0)
MCV: 85.5 fL (ref 78.0–100.0)
MCV: 86.2 fL (ref 78.0–100.0)
MCV: 87 fL (ref 78.0–100.0)
Platelets: 223 10*3/uL (ref 150–400)
Platelets: 259 10*3/uL (ref 150–400)
Platelets: 267 10*3/uL (ref 150–400)
RBC: 3.26 MIL/uL — ABNORMAL LOW (ref 3.87–5.11)
RBC: 3.28 MIL/uL — ABNORMAL LOW (ref 3.87–5.11)
RBC: 4.29 MIL/uL (ref 3.87–5.11)
RDW: 15.5 % (ref 11.5–15.5)
RDW: 15.6 % — ABNORMAL HIGH (ref 11.5–15.5)
RDW: 15.8 % — ABNORMAL HIGH (ref 11.5–15.5)
WBC: 18.5 10*3/uL — ABNORMAL HIGH (ref 4.0–10.5)
WBC: 19.9 10*3/uL — ABNORMAL HIGH (ref 4.0–10.5)
WBC: 22.1 10*3/uL — ABNORMAL HIGH (ref 4.0–10.5)

## 2011-01-22 LAB — CULTURE, BLOOD (ROUTINE X 2)
Culture: NO GROWTH
Culture: NO GROWTH

## 2011-01-22 LAB — DIFFERENTIAL
Basophils Absolute: 0 10*3/uL (ref 0.0–0.1)
Basophils Absolute: 0.1 10*3/uL (ref 0.0–0.1)
Basophils Relative: 0 % (ref 0–1)
Basophils Relative: 1 % (ref 0–1)
Eosinophils Absolute: 0 10*3/uL (ref 0.0–0.7)
Eosinophils Absolute: 0.1 10*3/uL (ref 0.0–0.7)
Eosinophils Relative: 0 % (ref 0–5)
Eosinophils Relative: 1 % (ref 0–5)
Lymphocytes Relative: 19 % (ref 12–46)
Lymphocytes Relative: 9 % — ABNORMAL LOW (ref 12–46)
Lymphs Abs: 1.8 10*3/uL (ref 0.7–4.0)
Lymphs Abs: 3.5 10*3/uL (ref 0.7–4.0)
Monocytes Absolute: 1.1 10*3/uL — ABNORMAL HIGH (ref 0.1–1.0)
Monocytes Absolute: 1.2 10*3/uL — ABNORMAL HIGH (ref 0.1–1.0)
Monocytes Relative: 6 % (ref 3–12)
Monocytes Relative: 6 % (ref 3–12)
Neutro Abs: 13.8 10*3/uL — ABNORMAL HIGH (ref 1.7–7.7)
Neutro Abs: 16.8 10*3/uL — ABNORMAL HIGH (ref 1.7–7.7)
Neutrophils Relative %: 74 % (ref 43–77)
Neutrophils Relative %: 85 % — ABNORMAL HIGH (ref 43–77)

## 2011-01-22 LAB — URINE CULTURE
Colony Count: NO GROWTH
Culture: NO GROWTH
Special Requests: NEGATIVE

## 2011-01-22 LAB — RPR: RPR Ser Ql: NONREACTIVE

## 2011-01-22 LAB — SAMPLE TO BLOOD BANK

## 2011-01-22 LAB — CREATININE, SERUM
Creatinine, Ser: 0.83 mg/dL (ref 0.4–1.2)
GFR calc Af Amer: >60 mL/min (ref >60)
GFR calc non Af Amer: >60 mL/min (ref >60)

## 2011-01-22 LAB — GC/CHLAMYDIA PROBE AMP, GENITAL
Chlamydia, DNA Probe: NEGATIVE
GC Probe Amp, Genital: NEGATIVE

## 2011-02-14 LAB — URINALYSIS, ROUTINE W REFLEX MICROSCOPIC
Bilirubin Urine: NEGATIVE
Glucose, UA: NEGATIVE mg/dL
Nitrite: NEGATIVE
Specific Gravity, Urine: 1.008 (ref 1.005–1.030)
pH: 7 (ref 5.0–8.0)

## 2011-02-14 LAB — BASIC METABOLIC PANEL
BUN: 3 mg/dL — ABNORMAL LOW (ref 6–23)
Calcium: 9.1 mg/dL (ref 8.4–10.5)
Chloride: 104 mEq/L (ref 96–112)
Creatinine, Ser: 0.8 mg/dL (ref 0.4–1.2)
GFR calc non Af Amer: >60 mL/min (ref >60)
Potassium: 3.7 mEq/L (ref 3.5–5.1)
Sodium: 140 mEq/L (ref 135–145)

## 2011-02-14 LAB — WET PREP, GENITAL: Yeast Wet Prep HPF POC: NONE SEEN

## 2011-02-14 LAB — URINE MICROSCOPIC-ADD ON

## 2011-02-14 LAB — DIFFERENTIAL
Basophils Absolute: 0.1 10*3/uL (ref 0.0–0.1)
Eosinophils Absolute: 0 10*3/uL (ref 0.0–0.7)
Eosinophils Relative: 1 % (ref 0–5)
Lymphocytes Relative: 29 % (ref 12–46)
Monocytes Relative: 7 % (ref 3–12)

## 2011-02-14 LAB — ABO/RH: ABO/RH(D): O POS

## 2011-02-14 LAB — CBC
HCT: 36.9 % (ref 36.0–46.0)
RBC: 4.57 MIL/uL (ref 3.87–5.11)
RDW: 12.8 % (ref 11.5–15.5)
WBC: 8.4 10*3/uL (ref 4.0–10.5)

## 2011-02-14 LAB — HCG, QUANTITATIVE, PREGNANCY: hCG, Beta Chain, Quant, S: 63065 m[IU]/mL — ABNORMAL HIGH (ref <5)

## 2011-02-14 LAB — GC/CHLAMYDIA PROBE AMP, GENITAL: GC Probe Amp, Genital: NEGATIVE

## 2011-02-14 LAB — PREGNANCY, URINE: Preg Test, Ur: POSITIVE

## 2011-03-21 NOTE — Assessment & Plan Note (Signed)
[facility]                                 ON-CALL NOTE   NAME:Boozer, Myrtie Cruise                        MRN:          [id number]  DATE:02/03/2008                            DOB:          1981-08-31    TIME:  6:32 p.m.   Phone # [phone]   The patient reports pain and fever for 2 weeks.  She has been seen by GI  in the past.  Was told that she has irritable bowel and feels that  something else is going on, in that she has fever as well.  Seems to be  cyclic.  She has tried fiber and has tried other remedies, none of which  has helped.  She has not seen Dr. Drue Novel in awhile and was last seen by Dr.  Christella Hartigan in September.  I told the patient that she really needs to be  evaluated, either by Dr. Drue Novel or Dr. Christella Hartigan again to see if there is  something else going on.  I cannot explain the fever with her irritable  bowel.  She said she would call Dr. Drue Novel in the morning.   PRIMARY CARE PHYSICIAN:  Willow Ora, MD   COLONOSCOPY:  Dr. Christella Hartigan.   Parenthetically the patient also called earlier.  I go her answering  machine and left the message on her machine to call me back, which she  did indeed do.     Arta Silence, MD  Electronically Signed    RNS/MedQ  DD: 02/03/2008  DT: 02/03/2008  Job #: [id number]

## 2011-03-21 NOTE — Group Therapy Note (Signed)
[name] [name], [name] [name] NO.:  [id number]   MEDICAL RECORD NO.:  [id number]          PATIENT TYPE:  WOC   LOCATION:  [facility]                   FACILITY:  [facility]   PHYSICIAN:  Caren Griffins, CNM       DATE OF BIRTH:  [birth year]   DATE OF SERVICE:  11/15/2007                                  CLINIC NOTE   CHIEF COMPLAINT:  Follow-up miscarriage and positive Chlamydia test.   HISTORY OF PRESENT ILLNESS:  This is a 30 year old female who was seen  on December 7 and December 10 in the maternity admission unit for a  miscarriage.  She has had two ultrasounds initially showing a 5-week  intrauterine pregnancy and then a second ultrasound which showed an  intrauterine gestational sac no longer visualized consistent with  complete spontaneous abortion.  She endorses having bleeding for about a  week after the second ultrasound, so from about December 10 through  December 17, with some cramping.  The bleeding has resolved, although,  she does have occasional cramping every once in a while.  Also at this  time in MAU she was found to have Chlamydia.  She was treated with, she  says, four pills in the maternity admission unit.  Her partner has been  treated as well.  She desires a follow-up test today.  She states that  she is sexually active although using condoms until they are certain  that the sexually transmitted disease has resolved.  She has clear  vaginal discharge, but says this is normal for her.  She does not have  any pain, itching, or any other concerns or lesions in the vaginal area.   PAST MEDICAL HISTORY:  None.  The patient is healthy.   PAST SURGICAL HISTORY:  The patient had an appendectomy.   GYN HISTORY:  The patient is a gravida 3, para 0.  She has one  spontaneous abortion and two elective abortions.   MENSTRUAL HISTORY:  The patient has typically 29 days between cycles.   FAMILY HISTORY:  Diabetes, hypertension, cancer, blood clots with her  grandfather, spherocytosis hereditary in the father and sister.   REVIEW OF SYSTEMS:  As in HPI with addition of occasional muscle aches,  fatigue, occasional nausea and vomiting.   ALLERGIES:  No known drug allergies.   PHYSICAL EXAMINATION:  VITAL SIGNS: Temperature 97.5, pulse 85, blood  pressure 109/77, weight 195, respiratory rate 20.  GENERAL:  In no acute distress.  CHEST:  Breasts are without lesions, masses, tenderness, and no nipple  discharge bilaterally.  CARDIOVASCULAR:  Regular rate and rhythm with no murmurs, rubs, or  gallops.  LUNGS:  Clear to auscultation bilaterally.  ABDOMEN:  Soft, nontender, positive bowel sounds.  EXTREMITIES:  No edema, 2+ pulses.  GENITOURINARY:  No lesions noted on the cervix.  No masses.  No  tenderness palpated on bimanual examination.   ASSESSMENT:  This is a 30 year old gravida 3, para 0 here for follow-up  miscarriage and Chlamydia.  1. Spontaneous abortion.  The patient seems to have had a complete      abortion with no sequelae.  She should not attempt to get pregnant      for another 1-2 months.  She understands this.  There is no further      imaging or testing that needs to be done in this matter.  The      patient may follow-up p.r.n.  2. Chlamydia.  The patient has been treated as has her partner for      chlamydia.  We obtained test of cure today along with a Pap smear.      The patient was advised to continue protective sex until both      partners have been tested negative.  In addition the patient      requests and I agree with testing for HIV, syphilis, and hepatitis      at this time.  We did obtain those blood tests today.  3. Preventive medicine.  The patient was due for her yearly Pap smear      which we obtained.  The patient was given instructions on self      breast examination.           ______________________________  Caren Griffins, CNM     DP/MEDQ  D:  11/15/2007  T:  11/15/2007  Job:  [id number]

## 2011-03-21 NOTE — Letter (Signed)
January 27, 2008    Kennith Gain   RE:  LINETTE, GUNDERSON  MRN:  [id number]  /  DOB:  25-Oct-1981   Ms. Joynt,   This is a letter to inform you that you are being dismissed from the  [facility] practice.  The reason for this dismissal is  that you have continued to insist of being treated for a condition for  which you are missing work but you absolutely refuse to be seen in the  office for this condition.  I have not seen you since August of 2008 in  the office, although you have continued to have problems and have called  for advice several times.  In addition to this refusal to be seen in the  office for your condition, you have been quite abusive to my nurse and  our office staff over the phone and I do not think that our relationship  should continue any further.  You have even insisted that I pay for you  to be seen in my office.  Over the next 30 days I am happy to see you on  an emergency-only basis.  Following that, I and my colleagues will not  be able to help you.  During this 30 days, you will need to establish  care with a different gastroenterologist.  Of course, as always, I am  happy to forward any records you need to this new GI physician, or any  other physicians that you wish.    Sincerely,      Rachael Fee, MD  Electronically Signed    DPJ/MedQ  DD: 01/27/2008  DT: 01/27/2008  Job #: [id number]   CC:    Willow Ora, MD

## 2011-03-21 NOTE — Assessment & Plan Note (Signed)
[facility]                         GASTROENTEROLOGY OFFICE NOTE   NAME:[name], [name] HARK                      MRN:          [id number]  DATE:07/16/2007                            DOB:          12-17-1980    [name] called earlier yesterday about her upcoming follow-up appointment  with me.  She said she wants to cancel it unless there are any specific  recommendations I think I may have at that point.   I spoke with her on the phone today.  She does have intermittent nausea  and vomiting; this happens about once a month.  Now she tells me that  she takes Prilosec over-the-counter at those times and it does seem to  help.  I recommended she begin taking the Prilosec over-the-counter on a  daily basis (probably 20-30 minutes before her lunch or dinner meal) for  the next couple of months, and see if that impacts her cyclical  problems.  She will call me back if she is still having problems.     Rachael Fee, MD  Electronically Signed    DPJ/MedQ  DD: 07/16/2007  DT: 07/17/2007  Job #: [id number]   cc:   Willow Ora, MD

## 2011-03-21 NOTE — Assessment & Plan Note (Signed)
[facility]                         GASTROENTEROLOGY OFFICE NOTE   NAME:[name], [name] PASQUINI                      MRN:          [id number]  DATE:06/26/2007                            DOB:          [birth year]    GI PROBLEM LIST:  Mild intermittent rectal bleeding.  Not anemic.  Small  bits of blood on the tissue paper.  Colonoscopy in March 2008 was normal  except for small internal hemorrhoids.   INTERVAL HISTORY:  I last saw [name] [name] 5 months ago.  She called last week  with vomiting and diarrheal illness.  She had profuse diarrhea and  vomiting.  This lasted 2 to 3 days.  Phenergan taken once, dramatically  helped her nausea and vomiting.  She is back to her baseline.  It turns  out that her baseline does not seem very normal though.  She is bothered  by lower abdominal spasms.  Levsin taken as needed does not seem to help  too much.  She also has intermittent epigastric pains that are not  related to eating or moving her bowels.  She also says that this  vomiting and diarrheal illness is fairly cyclical, and that it happens  about once a month or so.   CURRENT MEDICATIONS:  None.   PHYSICAL EXAMINATION:  Weight is 189 pounds.  Blood pressure 112/64.  Pulse 80.  CONSTITUTIONAL:  Generally well appearing.  LUNGS:  Clear to auscultation bilaterally.  ABDOMEN:  Soft, nontender, non-distended.  Normal bowel sounds.  EXTREMITIES:  No lower extremity edema.   ASSESSMENT AND PLAN:  A 30 year old woman with probable recent  infectious gastroenteritis, intermittent abdominal pains.   I did not mention above that she had labs drawn yesterday showing  essentially normal CBC and complete metabolic profile, except for mild  anemia with a hemoglobin of 11.5, and her potassium was slightly low at  3.4.   Her acute illness seems to be improving.  She is actually getting  constipated again, so I recommended she get back on her fiber  supplements that she takes  normally on a daily basis.  Levsin 1 pill as  needed is not helping for her lower abdominal spasm, so I recommended  she double that.  She does have about once a month of acute illnesses  consisting of vomiting and diarrhea.  Her colonoscopy was essentially  normal.  I will proceed with an EGD in the near future to see if perhaps  she has gastric disease.  Lastly, her epigastric pains that occur 2 to 3  times a week perhaps are biliary.  She says biliary problems do run in  her family, so she will have an ultrasound done at her soonest  convenience.    [name] Fee, MD  Electronically Signed   DPJ/MedQ  DD: 06/26/2007  DT: 06/27/2007  Job #: [id number]   cc:   [name] Ora, MD

## 2011-03-24 NOTE — Assessment & Plan Note (Signed)
[facility]                         GASTROENTEROLOGY OFFICE NOTE   NAME:Fedorchak, ORIYA KETTERING                      MRN:          [id number]  DATE:01/29/2007                            DOB:          1981/02/09    GI PROBLEM LIST:  Mild intermittent rectal bleeding.  Not anemic.  Small  a bit of blood on tissue paper.  Colonoscopy March 2008 normal except  for small internal hemorrhoids.   INTERVAL HISTORY:  I last saw [name] [name] at the time of her colonoscopy.  Since then, she tried fiber supplementation, but this caused a lot of  bloating, so she stopped.  She is not very concerned about the bleeding  since it is quite minor, and I agree that.  This does sound  hemorrhoidal.  She does have mild intermittent dyspepsia that is quite  bothersome.  She does have some heartburn symptoms and acid  regurgitation, so I recommended she try proton pump inhibitor.  We have  given her samples for OTC Prilosec, and I will call her in a  prescription for a proton pump inhibitor that she will be taking 20 to  30 minutes prior to her lunch or dinner meal on a daily basis, as though  are the meals that she eats most regularly.  She will return to see me  in 6 to 8 weeks, and sooner if needed.     Rachael Fee, MD  Electronically Signed    DPJ/MedQ  DD: 01/29/2007  DT: 01/29/2007  Job #: [id number]   cc:   Willow Ora, MD

## 2011-03-24 NOTE — Assessment & Plan Note (Signed)
[facility]                         GASTROENTEROLOGY OFFICE NOTE   NAME:[name] [name], [name] [name]                      MRN:          [id number]  DATE:01/02/2007                            DOB:          09-29-1981    REASON FOR REFERRAL:  Dr. Drue Novel asked me to evaluate [name] [name] in  consultation regarding abnormal bowel habits, intermittent abdominal  pain, intermittent bright red blood per rectum.   HISTORY OF PRESENT ILLNESS:  [name] [name] is a very pleasant 30 year old  woman who had normal bowel habits (once daily easy to move bowel  movements, nonbloody) until approximately two years ago.  She had a  severe vomiting and diarrhea illness that kept her at home for  approximately two weeks.  Her severe symptoms resolved.  Ever since  then, her bowels have just not really gone back to normal.  She  describes alternating constipation and loose stools.  She says most days  she is fine.  She has one bowel movement a day and that can last for  weeks on end, then she will go on a phase of having several loose stools  in the morning followed by constipation or visa versa.  She has  intermittent bright red blood per rectum on a once weekly basis.  She  has tried cutting dairy from her diet and did not notice much of a  difference.   REVIEW OF SYSTEMS:  Notable for a 15 pound weight gain in the past 6  months.   LABORATORY DATA:  Recent lab tests show essentially normal CBC and  normal complete metabolic profile.   PAST MEDICAL HISTORY:  Status post appendectomy in 1995.   CURRENT MEDICATIONS:  None.   ALLERGIES:  No known drug allergies.   SOCIAL HISTORY:  Single, lives with her father, works in [employer],  nonsmoker, rarely drinks alcohol, rare caffeine, rare sports drinks.   FAMILY HISTORY:  No colon cancer or colon polyps.   PHYSICAL EXAMINATION:  VITAL SIGNS:  Height 5 feet 5 inches, 193 pounds,  blood pressure 118/88, pulse 84.  CONSTITUTIONAL:   Generally well-appearing, neurologically alert and  oriented x3.  HEENT:  Eyes:  Extraocular movements intact.  Mouth:  Oropharynx moist,  no lesions.  NECK:  Supple.  No lymphadenopathy.  CARDIOVASCULAR:  Heart regular rate and rhythm.  LUNGS:  Clear to auscultation bilaterally.  ABDOMEN:  Soft, nontender, nondistended, normal bowel sounds.  EXTREMITIES:  No lower extremity edema.  SKIN:  No rash or lesions on upper or lower extremities.   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   She sees a small amount of rectal blood on a once weekly basis.  Fortunately, she is not anemic.  We should proceed with full colonoscopy  to make sure there is nothing serious going on such as neoplasm.  Her  symptoms sound much more like IBS than neoplasm or irritable bowel  disease.  I suggested she first try fiber supplementation with Citrucel  titrating upwards as needed.  We will see how she responds  to that at  the time of her colonoscopy.  May need to add antispasmodics, but I will  do that at that time.  I see no reason for any further blood tests or  imaging studies prior to colonoscopy.     Rachael Fee, MD  Electronically Signed    DPJ/MedQ  DD: 01/02/2007  DT: 01/02/2007  Job #: [id number]   cc:   [name] Ora, MD

## 2011-08-14 LAB — URINALYSIS, ROUTINE W REFLEX MICROSCOPIC
Glucose, UA: NEGATIVE
Specific Gravity, Urine: >1.03 — ABNORMAL HIGH
pH: 6

## 2011-08-14 LAB — CBC
HCT: 37.2
Hemoglobin: 12.7
MCHC: 34.1
MCV: 80.2
Platelets: 332
RBC: 4.64
RDW: 13.7
WBC: 4.3

## 2011-08-14 LAB — ABO/RH: ABO/RH(D): O POS

## 2011-08-14 LAB — POCT PREGNANCY, URINE
Operator id: 117411
Preg Test, Ur: POSITIVE

## 2011-08-14 LAB — HCG, QUANTITATIVE, PREGNANCY: hCG, Beta Chain, Quant, S: 694 — ABNORMAL HIGH

## 2011-08-14 LAB — GC/CHLAMYDIA PROBE AMP, GENITAL
Chlamydia, DNA Probe: POSITIVE — AB
GC Probe Amp, Genital: NEGATIVE

## 2011-08-14 LAB — WET PREP, GENITAL: Clue Cells Wet Prep HPF POC: NONE SEEN

## 2014-08-06 ENCOUNTER — Inpatient Hospital Stay (HOSPITAL_COMMUNITY): Payer: Medicaid Other

## 2014-08-06 ENCOUNTER — Inpatient Hospital Stay (HOSPITAL_COMMUNITY)
Admission: AD | Admit: 2014-08-06 | Discharge: 2014-08-07 | Disposition: A | Discharge status: Home / Self Care | Payer: Medicaid Other | Source: Ambulatory Visit | Attending: Obstetrics & Gynecology | Admitting: Obstetrics & Gynecology

## 2014-08-06 ENCOUNTER — Encounter (HOSPITAL_COMMUNITY): Payer: Self-pay | Admitting: *Deleted

## 2014-08-06 DIAGNOSIS — Z3A01 Less than 8 weeks gestation of pregnancy: Secondary | ICD-10-CM | POA: Diagnosis not present

## 2014-08-06 DIAGNOSIS — R102 Pelvic and perineal pain: Secondary | ICD-10-CM

## 2014-08-06 DIAGNOSIS — N949 Unspecified condition associated with female genital organs and menstrual cycle: Secondary | ICD-10-CM | POA: Diagnosis not present

## 2014-08-06 DIAGNOSIS — Z3200 Encounter for pregnancy test, result unknown: Secondary | ICD-10-CM | POA: Diagnosis present

## 2014-08-06 DIAGNOSIS — O26891 Other specified pregnancy related conditions, first trimester: Secondary | ICD-10-CM

## 2014-08-06 DIAGNOSIS — O9989 Other specified diseases and conditions complicating pregnancy, childbirth and the puerperium: Secondary | ICD-10-CM | POA: Insufficient documentation

## 2014-08-06 HISTORY — DX: Anemia, unspecified: D64.9

## 2014-08-06 HISTORY — DX: Gestational diabetes mellitus in pregnancy, unspecified control: O24.419

## 2014-08-06 HISTORY — DX: Benign neoplasm of connective and other soft tissue, unspecified: D21.9

## 2014-08-06 HISTORY — DX: Chlamydial infection, unspecified: A74.9

## 2014-08-06 HISTORY — DX: Irritable bowel syndrome, unspecified: K58.9

## 2014-08-06 LAB — URINALYSIS, ROUTINE W REFLEX MICROSCOPIC
Bilirubin Urine: NEGATIVE
Glucose, UA: NEGATIVE mg/dL
HGB URINE DIPSTICK: NEGATIVE
Ketones, ur: NEGATIVE mg/dL
Leukocytes, UA: NEGATIVE
NITRITE: NEGATIVE
PH: 6 (ref 5.0–8.0)
Protein, ur: NEGATIVE mg/dL
SPECIFIC GRAVITY, URINE: 1.015 (ref 1.005–1.030)
UROBILINOGEN UA: 0.2 mg/dL (ref 0.0–1.0)

## 2014-08-06 LAB — CBC
HEMATOCRIT: 36.9 % (ref 36.0–46.0)
Hemoglobin: 12.6 g/dL (ref 12.0–15.0)
MCH: 27.6 pg (ref 26.0–34.0)
MCHC: 34.1 g/dL (ref 30.0–36.0)
MCV: 80.9 fL (ref 78.0–100.0)
PLATELETS: 359 10*3/uL (ref 150–400)
RBC: 4.56 MIL/uL (ref 3.87–5.11)
RDW: 13 % (ref 11.5–15.5)
WBC: 11.3 10*3/uL — AB (ref 4.0–10.5)

## 2014-08-06 LAB — HCG, QUANTITATIVE, PREGNANCY: hCG, Beta Chain, Quant, S: 2222 m[IU]/mL — ABNORMAL HIGH (ref <5)

## 2014-08-06 LAB — POCT PREGNANCY, URINE: PREG TEST UR: POSITIVE — AB

## 2014-08-06 NOTE — MAU Note (Signed)
PT  SAYS  SHE HAS REG CYCLES -  LMP WAS 8-30-   THIS WAS 2 DAYS LATE  AND ONLY LASTED 4 DAYS.  LAST SEX-  9-20.   NO BIRTH CONTROL.    3 HPT-  ALL POSITIVE.  DR Carlota Raspberry - DEL OTHER BABY.        SAW DR Carlota Raspberry LAST IN 2011.

## 2014-08-06 NOTE — MAU Provider Note (Signed)
History     CSN: [id number]  Arrival date and time: 08/06/14 2121   First Provider Initiated Contact with Patient 08/06/14 2239      No chief complaint on file.  HPI  [name] [name] is a 33 y.o. G2P1011 at 4 weeks 4 days presents today for a pregnancy test. She took a test at home that was positive. She denies any vaginal bleeding today. However, she has had "sharp cramps" that come and go. She is concerned because she think she may have had two miscarriages in the past, and she is very upset about the cramps.   No past medical history on file.  No past surgical history on file.  No family history on file.  History  Substance Use Topics  . Smoking status: Not on file  . Smokeless tobacco: Not on file  . Alcohol Use: Not on file    Allergies: Allergies not on file  No prescriptions prior to admission    ROS Physical Exam   Blood pressure 118/73, pulse 84, temperature 99.1 F (37.3 C), temperature source Oral, resp. rate 18, height 5\' 4"  (1.626 m), weight 77.282 kg (170 lb 6 oz), last menstrual period 07/05/2014.  Physical Exam  Nursing note and vitals reviewed. Constitutional: She is oriented to person, place, and time. She appears well-developed and well-nourished. No distress.  Cardiovascular: Normal rate.   Respiratory: Effort normal.  GI: Soft. There is no tenderness. There is no rebound.  Neurological: She is alert and oriented to person, place, and time.  Skin: Skin is warm and dry.  Psychiatric: She has a normal mood and affect.    MAU Course  Procedures  Results for orders placed during the hospital encounter of 08/06/14 (from the past 24 hour(s))  URINALYSIS, ROUTINE W REFLEX MICROSCOPIC     Status: None   Collection Time    08/06/14  9:40 PM      Result Value Ref Range   Color, Urine YELLOW  YELLOW   APPearance CLEAR  CLEAR   Specific Gravity, Urine 1.015  1.005 - 1.030   pH 6.0  5.0 - 8.0   Glucose, UA NEGATIVE  NEGATIVE mg/dL   Hgb urine  dipstick NEGATIVE  NEGATIVE   Bilirubin Urine NEGATIVE  NEGATIVE   Ketones, ur NEGATIVE  NEGATIVE mg/dL   Protein, ur NEGATIVE  NEGATIVE mg/dL   Urobilinogen, UA 0.2  0.0 - 1.0 mg/dL   Nitrite NEGATIVE  NEGATIVE   Leukocytes, UA NEGATIVE  NEGATIVE  POCT PREGNANCY, URINE     Status: Abnormal   Collection Time    08/06/14 10:11 PM      Result Value Ref Range   Preg Test, Ur POSITIVE (*) NEGATIVE   US Ob Comp Less 14 Wks  08/07/2014   CLINICAL DATA:  Pregnant, abdominal pain, beta HCG 2222  EXAM: OBSTETRIC <14 WK [place] AND TRANSVAGINAL OB US  TECHNIQUE: Both transabdominal and transvaginal ultrasound examinations were performed for complete evaluation of the gestation as well as the maternal uterus, adnexal regions, and pelvic cul-de-sac. Transvaginal technique was performed to assess early pregnancy.  COMPARISON:  None.  FINDINGS: Intrauterine gestational sac: Visualized/normal in shape.  Yolk sac:  Not visualized  Embryo:  Not visualized  MSD:  3.4  mm   4 w   6  d  [place] EDC: 04/09/2015  Maternal uterus/adnexae: No subarachnoid hemorrhage.  Two uterine fibroids measuring up to 11 mm.  Left ovary is within normal limits, measuring 2.8 x 1.7 x  2.0 cm.  Right ovary measures 3.7 x 1.9 x 2.3 cm and is notable for a corpus luteal cyst.  Trace pelvic ascites.  IMPRESSION: Probable intrauterine gestational sac.  No yolk sac or fetal pole.  Serial beta HCG is suggested. Consider follow-up pelvic ultrasound in 14 days to confirm from viability.   Electronically Signed   By: Julian Hy M.D.   On: 08/07/2014 00:12   US Ob Transvaginal  08/07/2014   CLINICAL DATA:  Pregnant, abdominal pain, beta HCG 2222  EXAM: OBSTETRIC <14 WK [place] AND TRANSVAGINAL OB US  TECHNIQUE: Both transabdominal and transvaginal ultrasound examinations were performed for complete evaluation of the gestation as well as the maternal uterus, adnexal regions, and pelvic cul-de-sac. Transvaginal technique was performed to assess early  pregnancy.  COMPARISON:  None.  FINDINGS: Intrauterine gestational sac: Visualized/normal in shape.  Yolk sac:  Not visualized  Embryo:  Not visualized  MSD:  3.4  mm   4 w   6  d  [place] EDC: 04/09/2015  Maternal uterus/adnexae: No subarachnoid hemorrhage.  Two uterine fibroids measuring up to 11 mm.  Left ovary is within normal limits, measuring 2.8 x 1.7 x 2.0 cm.  Right ovary measures 3.7 x 1.9 x 2.3 cm and is notable for a corpus luteal cyst.  Trace pelvic ascites.  IMPRESSION: Probable intrauterine gestational sac.  No yolk sac or fetal pole.  Serial beta HCG is suggested. Consider follow-up pelvic ultrasound in 14 days to confirm from viability.   Electronically Signed   By: Julian Hy M.D.   On: 08/07/2014 00:12    Assessment and Plan   1. Pelvic pain affecting pregnancy in first trimester, antepartum    First trimester precautions Return to MAU as needed FU in 48 hours for repeat HCG  Mathis Bud 08/06/2014, 10:47 PM

## 2014-08-07 ENCOUNTER — Encounter (HOSPITAL_COMMUNITY): Payer: Self-pay | Admitting: *Deleted

## 2014-08-07 DIAGNOSIS — O26891 Other specified pregnancy related conditions, first trimester: Secondary | ICD-10-CM

## 2014-08-07 LAB — HIV ANTIBODY (ROUTINE TESTING W REFLEX): HIV: NONREACTIVE

## 2014-08-07 NOTE — MAU Provider Note (Signed)
Attestation of Attending Supervision of Advanced Practitioner (PA/CNM/NP): Evaluation and management procedures were performed by the Advanced Practitioner under my supervision and collaboration.  I have reviewed the Advanced Practitioner's note and chart, and I agree with the management and plan.  Ahmed Inniss, MD, FACOG Attending Obstetrician & Gynecologist Faculty Practice, [facility]   

## 2014-08-07 NOTE — Discharge Instructions (Signed)
First Trimester of Pregnancy The first trimester of pregnancy is from week 1 until the end of week 12 (months 1 through 3). A week after a sperm fertilizes an egg, the egg will implant on the wall of the uterus. This embryo will begin to develop into a baby. Genes from you and your partner are forming the baby. The female genes determine whether the baby is a boy or a girl. At 6-8 weeks, the eyes and face are formed, and the heartbeat can be seen on ultrasound. At the end of 12 weeks, all the baby's organs are formed.  Now that you are pregnant, you will want to do everything you can to have a healthy baby. Two of the most important things are to get good prenatal care and to follow your health care provider's instructions. Prenatal care is all the medical care you receive before the baby's birth. This care will help prevent, find, and treat any problems during the pregnancy and childbirth. BODY CHANGES Your body goes through many changes during pregnancy. The changes vary from woman to woman.   You may gain or lose a couple of pounds at first.  You may feel sick to your stomach (nauseous) and throw up (vomit). If the vomiting is uncontrollable, call your health care provider.  You may tire easily.  You may develop headaches that can be relieved by medicines approved by your health care provider.  You may urinate more often. Painful urination may mean you have a bladder infection.  You may develop heartburn as a result of your pregnancy.  You may develop constipation because certain hormones are causing the muscles that push waste through your intestines to slow down.  You may develop hemorrhoids or swollen, bulging veins (varicose veins).  Your breasts may begin to grow larger and become tender. Your nipples may stick out more, and the tissue that surrounds them (areola) may become darker.  Your gums may bleed and may be sensitive to brushing and flossing.  Dark spots or blotches (chloasma,  mask of pregnancy) may develop on your face. This will likely fade after the baby is born.  Your menstrual periods will stop.  You may have a loss of appetite.  You may develop cravings for certain kinds of food.  You may have changes in your emotions from day to day, such as being excited to be pregnant or being concerned that something may go wrong with the pregnancy and baby.  You may have more vivid and strange dreams.  You may have changes in your hair. These can include thickening of your hair, rapid growth, and changes in texture. Some women also have hair loss during or after pregnancy, or hair that feels dry or thin. Your hair will most likely return to normal after your baby is born. WHAT TO EXPECT AT YOUR PRENATAL VISITS During a routine prenatal visit:  You will be weighed to make sure you and the baby are growing normally.  Your blood pressure will be taken.  Your abdomen will be measured to track your baby's growth.  The fetal heartbeat will be listened to starting around week 10 or 12 of your pregnancy.  Test results from any previous visits will be discussed. Your health care provider may ask you:  How you are feeling.  If you are feeling the baby move.  If you have had any abnormal symptoms, such as leaking fluid, bleeding, severe headaches, or abdominal cramping.  If you have any questions. Other tests   that may be performed during your first trimester include:  Blood tests to find your blood type and to check for the presence of any previous infections. They will also be used to check for low iron levels (anemia) and Rh antibodies. Later in the pregnancy, blood tests for diabetes will be done along with other tests if problems develop.  Urine tests to check for infections, diabetes, or protein in the urine.  An ultrasound to confirm the proper growth and development of the baby.  An amniocentesis to check for possible genetic problems.  Fetal screens for  spina bifida and Down syndrome.  You may need other tests to make sure you and the baby are doing well. HOME CARE INSTRUCTIONS  Medicines  Follow your health care provider's instructions regarding medicine use. Specific medicines may be either safe or unsafe to take during pregnancy.  Take your prenatal vitamins as directed.  If you develop constipation, try taking a stool softener if your health care provider approves. Diet  Eat regular, well-balanced meals. Choose a variety of foods, such as meat or vegetable-based protein, fish, milk and low-fat dairy products, vegetables, fruits, and whole grain breads and cereals. Your health care provider will help you determine the amount of weight gain that is right for you.  Avoid raw meat and uncooked cheese. These carry germs that can cause birth defects in the baby.  Eating four or five small meals rather than three large meals a day may help relieve nausea and vomiting. If you start to feel nauseous, eating a few soda crackers can be helpful. Drinking liquids between meals instead of during meals also seems to help nausea and vomiting.  If you develop constipation, eat more high-fiber foods, such as fresh vegetables or fruit and whole grains. Drink enough fluids to keep your urine clear or pale yellow. Activity and Exercise  Exercise only as directed by your health care provider. Exercising will help you:  Control your weight.  Stay in shape.  Be prepared for labor and delivery.  Experiencing pain or cramping in the lower abdomen or low back is a good sign that you should stop exercising. Check with your health care provider before continuing normal exercises.  Try to avoid standing for long periods of time. Move your legs often if you must stand in one place for a long time.  Avoid heavy lifting.  Wear low-heeled shoes, and practice good posture.  You may continue to have sex unless your health care provider directs you  otherwise. Relief of Pain or Discomfort  Wear a good support bra for breast tenderness.   Take warm sitz baths to soothe any pain or discomfort caused by hemorrhoids. Use hemorrhoid cream if your health care provider approves.   Rest with your legs elevated if you have leg cramps or low back pain.  If you develop varicose veins in your legs, wear support hose. Elevate your feet for 15 minutes, 3-4 times a day. Limit salt in your diet. Prenatal Care  Schedule your prenatal visits by the twelfth week of pregnancy. They are usually scheduled monthly at first, then more often in the last 2 months before delivery.  Write down your questions. Take them to your prenatal visits.  Keep all your prenatal visits as directed by your health care provider. Safety  Wear your seat belt at all times when driving.  Make a list of emergency phone numbers, including numbers for family, friends, the hospital, and police and fire departments. General Tips    Ask your health care provider for a referral to a local prenatal education class. Begin classes no later than at the beginning of month 6 of your pregnancy.  Ask for help if you have counseling or nutritional needs during pregnancy. Your health care provider can offer advice or refer you to specialists for help with various needs.  Do not use hot tubs, steam rooms, or saunas.  Do not douche or use tampons or scented sanitary pads.  Do not cross your legs for long periods of time.  Avoid cat litter boxes and soil used by cats. These carry germs that can cause birth defects in the baby and possibly loss of the fetus by miscarriage or stillbirth.  Avoid all smoking, herbs, alcohol, and medicines not prescribed by your health care provider. Chemicals in these affect the formation and growth of the baby.  Schedule a dentist appointment. At home, brush your teeth with a soft toothbrush and be gentle when you floss. SEEK MEDICAL CARE IF:   You have  dizziness.  You have mild pelvic cramps, pelvic pressure, or nagging pain in the abdominal area.  You have persistent nausea, vomiting, or diarrhea.  You have a bad smelling vaginal discharge.  You have pain with urination.  You notice increased swelling in your face, hands, legs, or ankles. SEEK IMMEDIATE MEDICAL CARE IF:   You have a fever.  You are leaking fluid from your vagina.  You have spotting or bleeding from your vagina.  You have severe abdominal cramping or pain.  You have rapid weight gain or loss.  You vomit blood or material that looks like coffee grounds.  You are exposed to German measles and have never had them.  You are exposed to fifth disease or chickenpox.  You develop a severe headache.  You have shortness of breath.  You have any kind of trauma, such as from a fall or a car accident. Document Released: 10/17/2001 Document Revised: 03/09/2014 Document Reviewed: 09/02/2013 Exit[employer] Patient Information 2015 [employer]. This information is not intended to replace advice given to you by your health care provider. Make sure you discuss any questions you have with your health care provider.  

## 2014-08-27 ENCOUNTER — Encounter (HOSPITAL_BASED_OUTPATIENT_CLINIC_OR_DEPARTMENT_OTHER): Payer: Self-pay | Admitting: Emergency Medicine

## 2014-08-27 ENCOUNTER — Emergency Department (HOSPITAL_BASED_OUTPATIENT_CLINIC_OR_DEPARTMENT_OTHER)
Admission: EM | Admit: 2014-08-27 | Discharge: 2014-08-27 | Disposition: A | Discharge status: Home / Self Care | Payer: Medicaid Other | Attending: Emergency Medicine | Admitting: Emergency Medicine

## 2014-08-27 DIAGNOSIS — O23591 Infection of other part of genital tract in pregnancy, first trimester: Secondary | ICD-10-CM | POA: Insufficient documentation

## 2014-08-27 DIAGNOSIS — Z8632 Personal history of gestational diabetes: Secondary | ICD-10-CM | POA: Insufficient documentation

## 2014-08-27 DIAGNOSIS — Z8719 Personal history of other diseases of the digestive system: Secondary | ICD-10-CM | POA: Diagnosis not present

## 2014-08-27 DIAGNOSIS — O9989 Other specified diseases and conditions complicating pregnancy, childbirth and the puerperium: Secondary | ICD-10-CM | POA: Diagnosis present

## 2014-08-27 DIAGNOSIS — Z862 Personal history of diseases of the blood and blood-forming organs and certain disorders involving the immune mechanism: Secondary | ICD-10-CM | POA: Diagnosis not present

## 2014-08-27 DIAGNOSIS — Z9089 Acquired absence of other organs: Secondary | ICD-10-CM | POA: Insufficient documentation

## 2014-08-27 DIAGNOSIS — Z3A01 Less than 8 weeks gestation of pregnancy: Secondary | ICD-10-CM | POA: Insufficient documentation

## 2014-08-27 DIAGNOSIS — Z8619 Personal history of other infectious and parasitic diseases: Secondary | ICD-10-CM | POA: Diagnosis not present

## 2014-08-27 DIAGNOSIS — Z3490 Encounter for supervision of normal pregnancy, unspecified, unspecified trimester: Secondary | ICD-10-CM

## 2014-08-27 DIAGNOSIS — N760 Acute vaginitis: Secondary | ICD-10-CM

## 2014-08-27 DIAGNOSIS — B9689 Other specified bacterial agents as the cause of diseases classified elsewhere: Secondary | ICD-10-CM

## 2014-08-27 LAB — URINALYSIS, ROUTINE W REFLEX MICROSCOPIC
Bilirubin Urine: NEGATIVE
Glucose, UA: NEGATIVE mg/dL
Hgb urine dipstick: NEGATIVE
KETONES UR: NEGATIVE mg/dL
LEUKOCYTES UA: NEGATIVE
NITRITE: NEGATIVE
PROTEIN: NEGATIVE mg/dL
Specific Gravity, Urine: 1.011 (ref 1.005–1.030)
UROBILINOGEN UA: 1 mg/dL (ref 0.0–1.0)
pH: 7.5 (ref 5.0–8.0)

## 2014-08-27 LAB — WET PREP, GENITAL
TRICH WET PREP: NONE SEEN
YEAST WET PREP: NONE SEEN

## 2014-08-27 LAB — HCG, QUANTITATIVE, PREGNANCY: hCG, Beta Chain, Quant, S: 123956 m[IU]/mL — ABNORMAL HIGH (ref <5)

## 2014-08-27 LAB — OB RESULTS CONSOLE GC/CHLAMYDIA
CHLAMYDIA, DNA PROBE: NEGATIVE
Gonorrhea: NEGATIVE

## 2014-08-27 LAB — PREGNANCY, URINE: Preg Test, Ur: POSITIVE — AB

## 2014-08-27 MED ORDER — METRONIDAZOLE 0.75 % VA GEL: 1.0000 | Freq: Two times a day (BID) | VAGINAL | Status: AC

## 2014-08-27 NOTE — ED Provider Notes (Signed)
CSN: [id number]     Arrival date & time 08/27/14  1731 History   First MD Initiated Contact with Patient 08/27/14 1740     Chief Complaint  Patient presents with  . Abdominal Pain     (Consider location/radiation/quality/duration/timing/severity/associated sxs/prior Treatment) HPI  33 year old female with hx of IBS, prior STD, fibroid, who is [redacted] weeks pregnant presents complaining of low abdominal pain.  Patient reports for the past 1-2 weeks she has had low abnormal discomfort with vaginal irritation that has been persistent. She reports occasionally sharp pain to her low abdomen lasting for seconds nothing makes it better or worse. She has no specific treatment tried. She is concerned for STD. States that she is [redacted] weeks pregnant, had an ultrasound performed 3 weeks ago but states it was too early to see anything. There is no associated fever, chills, nausea vomiting diarrhea, chest pain, shortness of breath, productive cough, back pain, dysuria, hematuria, hematochezia or melena. Denies any vaginal bleeding or vaginal discharge. She is a G3 P1. She has had 2 sexual partners within the past 6 months. Denies any pain with sexual activities. Her last menstrual period was August 30.  Past Medical History  Diagnosis Date  . Gestational diabetes   . Fibroid   . IBS (irritable bowel syndrome)   . Chlamydia 2009  . Anemia    Past Surgical History  Procedure Laterality Date  . Appendectomy  1995   No family history on file. History  Substance Use Topics  . Smoking status: Never Smoker   . Smokeless tobacco: Never Used  . Alcohol Use: Yes     Comment: occassionally when not pregnant   OB History   Grav Para Term Preterm Abortions TAB SAB Ect Mult Living   3 1 1  1  1   1      Review of Systems  All other systems reviewed and are negative.     Allergies  Review of patient's allergies indicates no known allergies.  Home Medications   Prior to Admission medications   Not on  File   BP 114/73  Pulse 94  Temp(Src) 99.2 F (37.3 C) (Oral)  Resp 18  Ht 5\' 4"  (1.626 m)  Wt 170 lb (77.111 kg)  BMI 29.17 kg/m2  SpO2 100%  LMP 07/05/2014 Physical Exam  Nursing note and vitals reviewed. Constitutional: She appears well-developed and well-nourished. No distress.  HENT:  Head: Atraumatic.  Eyes: Conjunctivae are normal.  Neck: Neck supple.  Cardiovascular: Normal rate and regular rhythm.   Pulmonary/Chest: Effort normal and breath sounds normal. She exhibits no tenderness.  Abdominal: Soft. Bowel sounds are normal. She exhibits no distension. There is no tenderness. There is no rebound and no guarding.  No cva tenderness  Nongravid abdomen  Genitourinary:  Chaperone present:  No inguinal lymphadenopathy, normal external vaginal region without any rash or tenderness. Vaginal vault with yellowish vaginal discharge, no active bleeding. Cervical os with normal periods, closed. On bimanual exam, no adnexal tenderness or cervical motion tenderness.  Neurological: She is alert.  Skin: No rash noted.  Psychiatric: She has a normal mood and affect.    ED Course  Procedures (including critical care time)  Patient who is currently [redacted] weeks pregnant here with lower abdominal pain and vaginal discomfort. She has no significant pelvic pain suggestive of PID.  Work up initiated.    8:36 PM UA showed no evidence of urinary tract infection. Wet prep with moderate clue cells and many WBC. Pregnancy test  is positive with corresponding Quant to suggest patient is within the expected pregnancy time. I offer patient Rocephin and Zithromax for suspected STD however patient prefers to be treated if cultures are positive. Patient has followup appointment with her OB/GYN has scheduled which I encouraged a followup for further management of her pregnancy. At this time patient is stable for discharge with bacterial vaginosis treatment, including MetroGel. Return precautions  discussed.  Since patient has a benign abdomen and I have low suspicion for ectopic pregnancy, she has no vaginal bleeding and she has no significant pain on pelvic exam, I do not think that imaging is indicated at this time.  Labs Review Labs Reviewed  WET PREP, GENITAL - Abnormal; Notable for the following:    Clue Cells Wet Prep HPF POC MODERATE (*)    WBC, Wet Prep HPF POC MANY (*)    All other components within normal limits  HCG, QUANTITATIVE, PREGNANCY - Abnormal; Notable for the following:    hCG, Beta Chain, Laqueta Carina [id number] (*)    All other components within normal limits  PREGNANCY, URINE - Abnormal; Notable for the following:    Preg Test, Ur POSITIVE (*)    All other components within normal limits  GC/CHLAMYDIA PROBE AMP  URINALYSIS, ROUTINE W REFLEX MICROSCOPIC  RPR  HIV ANTIBODY (ROUTINE TESTING)  ABO/RH    Imaging Review No results found.   EKG Interpretation None      MDM   Final diagnoses:  BV (bacterial vaginosis)  Pregnancy    BP 120/77  Pulse 86  Temp(Src) 99.2 F (37.3 C) (Oral)  Resp 17  Ht 5\' 4"  (1.626 m)  Wt 170 lb (77.111 kg)  BMI 29.17 kg/m2  SpO2 100%  LMP 07/05/2014     Domenic Moras, PA-C 08/27/14 2038  Domenic Moras, PA-C 08/27/14 2039

## 2014-08-27 NOTE — ED Provider Notes (Signed)
Medical screening examination/treatment/procedure(s) were performed by non-physician practitioner and as supervising physician I was immediately available for consultation/collaboration.   EKG Interpretation None        Wandra Arthurs, MD 08/27/14 [phone]

## 2014-08-27 NOTE — Discharge Instructions (Signed)
Please follow up with your OBGYN as previously scheduled.  Take metrogel twice daily as prescribed for treatment of bacterial vaginosis.  If you are tested positive for other infections, you will be notify in 3-4 days.  Return if you have worsen abdominal pain.    Bacterial Vaginosis Bacterial vaginosis is an infection of the vagina. It happens when too many of certain germs (bacteria) grow in the vagina. HOME CARE  Take your medicine as told by your doctor.  Finish your medicine even if you start to feel better.  Do not have sex until you finish your medicine and are better.  Tell your sex partner that you have an infection. They should see their doctor for treatment.  Practice safe sex. Use condoms. Have only one sex partner. GET HELP IF:  You are not getting better after 3 days of treatment.  You have more grey fluid (discharge) coming from your vagina than before.  You have more pain than before.  You have a fever. MAKE SURE YOU:   Understand these instructions.  Will watch your condition.  Will get help right away if you are not doing well or get worse. Document Released: 08/01/2008 Document Revised: 08/13/2013 Document Reviewed: 06/04/2013 [facility], [place] Patient Information 2015 [place], [place]. This information is not intended to replace advice given to you by your health care provider. Make sure you discuss any questions you have with your health care provider.  First Trimester of Pregnancy The first trimester of pregnancy is from week 1 until the end of week 12 (months 1 through 3). A week after a sperm fertilizes an egg, the egg will implant on the wall of the uterus. This embryo will begin to develop into a baby. Genes from you and your partner are forming the baby. The female genes determine whether the baby is a boy or a girl. At 6-8 weeks, the eyes and face are formed, and the heartbeat can be seen on ultrasound. At the end of 12 weeks, all the baby's organs are formed.  Now  that you are pregnant, you will want to do everything you can to have a healthy baby. Two of the most important things are to get good prenatal care and to follow your health care provider's instructions. Prenatal care is all the medical care you receive before the baby's birth. This care will help prevent, find, and treat any problems during the pregnancy and childbirth. BODY CHANGES Your body goes through many changes during pregnancy. The changes vary from woman to woman.   You may gain or lose a couple of pounds at first.  You may feel sick to your stomach (nauseous) and throw up (vomit). If the vomiting is uncontrollable, call your health care provider.  You may tire easily.  You may develop headaches that can be relieved by medicines approved by your health care provider.  You may urinate more often. Painful urination may mean you have a bladder infection.  You may develop heartburn as a result of your pregnancy.  You may develop constipation because certain hormones are causing the muscles that push waste through your intestines to slow down.  You may develop hemorrhoids or swollen, bulging veins (varicose veins).  Your breasts may begin to grow larger and become tender. Your nipples may stick out more, and the tissue that surrounds them (areola) may become darker.  Your gums may bleed and may be sensitive to brushing and flossing.  Dark spots or blotches (chloasma, mask of pregnancy) may develop  on your face. This will likely fade after the baby is born.  Your menstrual periods will stop.  You may have a loss of appetite.  You may develop cravings for certain kinds of food.  You may have changes in your emotions from day to day, such as being excited to be pregnant or being concerned that something may go wrong with the pregnancy and baby.  You may have more vivid and strange dreams.  You may have changes in your hair. These can include thickening of your hair, rapid  growth, and changes in texture. Some women also have hair loss during or after pregnancy, or hair that feels dry or thin. Your hair will most likely return to normal after your baby is born. WHAT TO EXPECT AT YOUR PRENATAL VISITS During a routine prenatal visit:  You will be weighed to make sure you and the baby are growing normally.  Your blood pressure will be taken.  Your abdomen will be measured to track your baby's growth.  The fetal heartbeat will be listened to starting around week 10 or 12 of your pregnancy.  Test results from any previous visits will be discussed. Your health care provider may ask you:  How you are feeling.  If you are feeling the baby move.  If you have had any abnormal symptoms, such as leaking fluid, bleeding, severe headaches, or abdominal cramping.  If you have any questions. Other tests that may be performed during your first trimester include:  Blood tests to find your blood type and to check for the presence of any previous infections. They will also be used to check for low iron levels (anemia) and Rh antibodies. Later in the pregnancy, blood tests for diabetes will be done along with other tests if problems develop.  Urine tests to check for infections, diabetes, or protein in the urine.  An ultrasound to confirm the proper growth and development of the baby.  An amniocentesis to check for possible genetic problems.  Fetal screens for spina bifida and Down syndrome.  You may need other tests to make sure you and the baby are doing well. HOME CARE INSTRUCTIONS  Medicines  Follow your health care provider's instructions regarding medicine use. Specific medicines may be either safe or unsafe to take during pregnancy.  Take your prenatal vitamins as directed.  If you develop constipation, try taking a stool softener if your health care provider approves. Diet  Eat regular, well-balanced meals. Choose a variety of foods, such as meat or  vegetable-based protein, fish, milk and low-fat dairy products, vegetables, fruits, and whole grain breads and cereals. Your health care provider will help you determine the amount of weight gain that is right for you.  Avoid raw meat and uncooked cheese. These carry germs that can cause birth defects in the baby.  Eating four or five small meals rather than three large meals a day may help relieve nausea and vomiting. If you start to feel nauseous, eating a few soda crackers can be helpful. Drinking liquids between meals instead of during meals also seems to help nausea and vomiting.  If you develop constipation, eat more high-fiber foods, such as fresh vegetables or fruit and whole grains. Drink enough fluids to keep your urine clear or pale yellow. Activity and Exercise  Exercise only as directed by your health care provider. Exercising will help you:  Control your weight.  Stay in shape.  Be prepared for labor and delivery.  Experiencing pain or cramping  in the lower abdomen or low back is a good sign that you should stop exercising. Check with your health care provider before continuing normal exercises.  Try to avoid standing for long periods of time. Move your legs often if you must stand in one place for a long time.  Avoid heavy lifting.  Wear low-heeled shoes, and practice good posture.  You may continue to have sex unless your health care provider directs you otherwise. Relief of Pain or Discomfort  Wear a good support bra for breast tenderness.   Take warm sitz baths to soothe any pain or discomfort caused by hemorrhoids. Use hemorrhoid cream if your health care provider approves.   Rest with your legs elevated if you have leg cramps or low back pain.  If you develop varicose veins in your legs, wear support hose. Elevate your feet for 15 minutes, 3-4 times a day. Limit salt in your diet. Prenatal Care  Schedule your prenatal visits by the twelfth week of pregnancy.  They are usually scheduled monthly at first, then more often in the last 2 months before delivery.  Write down your questions. Take them to your prenatal visits.  Keep all your prenatal visits as directed by your health care provider. Safety  Wear your seat belt at all times when driving.  Make a list of emergency phone numbers, including numbers for family, friends, the hospital, and police and fire departments. General Tips  Ask your health care provider for a referral to a local prenatal education class. Begin classes no later than at the beginning of month 6 of your pregnancy.  Ask for help if you have counseling or nutritional needs during pregnancy. Your health care provider can offer advice or refer you to specialists for help with various needs.  Do not use hot tubs, steam rooms, or saunas.  Do not douche or use tampons or scented sanitary pads.  Do not cross your legs for long periods of time.  Avoid cat litter boxes and soil used by cats. These carry germs that can cause birth defects in the baby and possibly loss of the fetus by miscarriage or stillbirth.  Avoid all smoking, herbs, alcohol, and medicines not prescribed by your health care provider. Chemicals in these affect the formation and growth of the baby.  Schedule a dentist appointment. At home, brush your teeth with a soft toothbrush and be gentle when you floss. SEEK MEDICAL CARE IF:   You have dizziness.  You have mild pelvic cramps, pelvic pressure, or nagging pain in the abdominal area.  You have persistent nausea, vomiting, or diarrhea.  You have a bad smelling vaginal discharge.  You have pain with urination.  You notice increased swelling in your face, hands, legs, or ankles. SEEK IMMEDIATE MEDICAL CARE IF:   You have a fever.  You are leaking fluid from your vagina.  You have spotting or bleeding from your vagina.  You have severe abdominal cramping or pain.  You have rapid weight gain or  loss.  You vomit blood or material that looks like coffee grounds.  You are exposed to [place] measles and have never had them.  You are exposed to fifth disease or chickenpox.  You develop a severe headache.  You have shortness of breath.  You have any kind of trauma, such as from a fall or a car accident. Document Released: 10/17/2001 Document Revised: 03/09/2014 Document Reviewed: 09/02/2013 [facility] Patient Information 2015 [place], [place]. This information is not intended to replace  advice given to you by your health care provider. Make sure you discuss any questions you have with your health care provider.

## 2014-08-27 NOTE — ED Notes (Signed)
Lower abdominal pain. She is [redacted] weeks pregnant. Denies vaginal discharge. States she feels like it may be IBS.

## 2014-08-28 LAB — GC/CHLAMYDIA PROBE AMP
CT Probe RNA: NEGATIVE
GC Probe RNA: NEGATIVE

## 2014-08-28 LAB — HIV ANTIBODY (ROUTINE TESTING W REFLEX): HIV: NONREACTIVE

## 2014-08-28 LAB — RPR

## 2014-08-28 LAB — ABO/RH: ABO/RH(D): O POS

## 2014-09-07 ENCOUNTER — Encounter (HOSPITAL_BASED_OUTPATIENT_CLINIC_OR_DEPARTMENT_OTHER): Payer: Self-pay | Admitting: Emergency Medicine

## 2014-09-09 LAB — OB RESULTS CONSOLE ABO/RH: RH Type: POSITIVE

## 2014-09-09 LAB — OB RESULTS CONSOLE RUBELLA ANTIBODY, IGM: RUBELLA: NON-IMMUNE/NOT IMMUNE

## 2014-09-09 LAB — OB RESULTS CONSOLE HEPATITIS B SURFACE ANTIGEN: HEP B S AG: NEGATIVE

## 2014-09-09 LAB — OB RESULTS CONSOLE ANTIBODY SCREEN: Antibody Screen: NEGATIVE

## 2014-11-06 NOTE — L&D Delivery Note (Signed)
2254: Nurse call reports patient is pushing involuntarily.  In room to assess and infant +5 station.  Patient encouraged to push with contractions and delivered as below.   Delivery Note At 11:00 PM a viable female "Elijah" was delivered via Vaginal, Spontaneous Delivery (Presentation: Left Occiput Anterior with manual restitution to ROT).  After delivery of the head, maternal pushing efforts became ineffective, but no shoulder dystocia was noted.  Infant head out for approximately 30 seconds and color appeared dusky.  Infant grasped by anterior shoulder/armpit and manually delivered by provider.  Shoulders delivered easily, but  infant with flaccid tone and minimal grimace. Tactile stimulation and bulb suction given by provider within minimal response. Cord clamped and cut, then infant handed to nurse for further assessment at warmer. Infant APGAR 8;9. Cord blood collected and placenta delivered spontaneously and upon inspection a 3VC with a true knot was identified. Vaginal inspection revealed a small perineal  laceration that was hemostatic and required no repair. Patient informed that laceration will heal via approximation and care instructions given. Fundus firm, at the umbilicus, but a lime sized peduncle fibroid was palpated on maternal left parallel to the fundus.  Bleeding small, but trickling noted and further vaginal inspection revealed cervix approximately 2cm from introitus.  Mother hemodynamically stable and infant at the breast prior to provider exit.  Mother declines birth control.  Infant weight at one hour of life: 8lbs 4oz   Anesthesia: None  Episiotomy: None Lacerations: 1st degree;Perineal Suture Repair: None Est. Blood Loss (mL):  550  Mom to postpartum.  Baby to Couplet care / Skin to Skin.  Sister Carbone LYNN, CNM 04/23/2015, 11:52 PM

## 2015-03-15 LAB — OB RESULTS CONSOLE GBS: STREP GROUP B AG: POSITIVE

## 2015-04-12 ENCOUNTER — Inpatient Hospital Stay (HOSPITAL_COMMUNITY)
Admission: AD | Admit: 2015-04-12 | Payer: Medicaid Other | Source: Ambulatory Visit | Admitting: Obstetrics & Gynecology

## 2015-04-21 ENCOUNTER — Telehealth (HOSPITAL_COMMUNITY): Payer: Self-pay | Admitting: *Deleted

## 2015-04-21 ENCOUNTER — Encounter (HOSPITAL_COMMUNITY): Payer: Self-pay | Admitting: *Deleted

## 2015-04-21 NOTE — Telephone Encounter (Signed)
Preadmission screen  

## 2015-04-23 ENCOUNTER — Inpatient Hospital Stay (HOSPITAL_COMMUNITY)
Admission: RE | Admit: 2015-04-23 | Discharge: 2015-04-25 | DRG: 775 | Disposition: A | Discharge status: Home / Self Care | Payer: Medicaid Other | Source: Ambulatory Visit | Attending: Obstetrics and Gynecology | Admitting: Obstetrics and Gynecology

## 2015-04-23 ENCOUNTER — Encounter (HOSPITAL_COMMUNITY): Payer: Self-pay

## 2015-04-23 DIAGNOSIS — Z8249 Family history of ischemic heart disease and other diseases of the circulatory system: Secondary | ICD-10-CM | POA: Diagnosis not present

## 2015-04-23 DIAGNOSIS — Z8632 Personal history of gestational diabetes: Secondary | ICD-10-CM

## 2015-04-23 DIAGNOSIS — D62 Acute posthemorrhagic anemia: Secondary | ICD-10-CM | POA: Diagnosis present

## 2015-04-23 DIAGNOSIS — O480 Post-term pregnancy: Secondary | ICD-10-CM | POA: Diagnosis present

## 2015-04-23 DIAGNOSIS — O9902 Anemia complicating childbirth: Secondary | ICD-10-CM | POA: Diagnosis present

## 2015-04-23 DIAGNOSIS — D649 Anemia, unspecified: Secondary | ICD-10-CM

## 2015-04-23 DIAGNOSIS — Z833 Family history of diabetes mellitus: Secondary | ICD-10-CM

## 2015-04-23 DIAGNOSIS — O99824 Streptococcus B carrier state complicating childbirth: Secondary | ICD-10-CM | POA: Diagnosis present

## 2015-04-23 DIAGNOSIS — Z3490 Encounter for supervision of normal pregnancy, unspecified, unspecified trimester: Secondary | ICD-10-CM

## 2015-04-23 DIAGNOSIS — Z3A41 41 weeks gestation of pregnancy: Secondary | ICD-10-CM | POA: Diagnosis present

## 2015-04-23 LAB — CBC
HEMATOCRIT: 31.2 % — AB (ref 36.0–46.0)
HEMOGLOBIN: 10 g/dL — AB (ref 12.0–15.0)
MCH: 24.7 pg — ABNORMAL LOW (ref 26.0–34.0)
MCHC: 32.1 g/dL (ref 30.0–36.0)
MCV: 77 fL — AB (ref 78.0–100.0)
Platelets: 230 10*3/uL (ref 150–400)
RBC: 4.05 MIL/uL (ref 3.87–5.11)
RDW: 15.3 % (ref 11.5–15.5)
WBC: 8.5 10*3/uL (ref 4.0–10.5)

## 2015-04-23 LAB — TYPE AND SCREEN
ABO/RH(D): O POS
ANTIBODY SCREEN: NEGATIVE

## 2015-04-23 MED ORDER — SODIUM CHLORIDE 0.9 % IJ SOLN: 3.0000 mL | INTRAMUSCULAR | Status: DC | PRN

## 2015-04-23 MED ORDER — LACTATED RINGERS IV SOLN
INTRAVENOUS | Status: DC
  Administered 2015-04-23: 14:00:00 via INTRAVENOUS

## 2015-04-23 MED ORDER — LIDOCAINE HCL (PF) 1 % IJ SOLN
30.0000 mL | INTRAMUSCULAR | Status: DC | PRN
  Filled 2015-04-23: qty 30

## 2015-04-23 MED ORDER — FENTANYL CITRATE (PF) 100 MCG/2ML IJ SOLN
50.0000 ug | INTRAMUSCULAR | Status: DC | PRN
Start: 2015-04-23 — End: 2015-04-24
  Administered 2015-04-23: 50 ug via INTRAVENOUS

## 2015-04-23 MED ORDER — ONDANSETRON HCL 4 MG/2ML IJ SOLN: 4.0000 mg | Freq: Four times a day (QID) | INTRAMUSCULAR | Status: DC | PRN

## 2015-04-23 MED ORDER — CITRIC ACID-SODIUM CITRATE 334-500 MG/5ML PO SOLN
30.0000 mL | ORAL | Status: DC | PRN
Start: 2015-04-23 — End: 2015-04-24

## 2015-04-23 MED ORDER — SODIUM CHLORIDE 0.9 % IV SOLN
2.0000 g | Freq: Four times a day (QID) | INTRAVENOUS | Status: DC
  Administered 2015-04-23: 2 g via INTRAVENOUS
  Filled 2015-04-23 (×4): qty 2000

## 2015-04-23 MED ORDER — OXYTOCIN BOLUS FROM INFUSION: 500.0000 mL | INTRAVENOUS | Status: DC

## 2015-04-23 MED ORDER — SODIUM CHLORIDE 0.9 % IJ SOLN: 3.0000 mL | Freq: Two times a day (BID) | INTRAMUSCULAR | Status: DC

## 2015-04-23 MED ORDER — LACTATED RINGERS IV SOLN: 500.0000 mL | INTRAVENOUS | Status: DC | PRN

## 2015-04-23 MED ORDER — FENTANYL CITRATE (PF) 100 MCG/2ML IJ SOLN
INTRAMUSCULAR | Status: AC
  Administered 2015-04-23: 50 ug via INTRAVENOUS
  Filled 2015-04-23: qty 2

## 2015-04-23 MED ORDER — OXYTOCIN 40 UNITS IN LACTATED RINGERS INFUSION - SIMPLE MED
62.5000 mL/h | INTRAVENOUS | Status: DC
  Administered 2015-04-23: 999 mL/h via INTRAVENOUS
  Administered 2015-04-23: 62.5 mL/h via INTRAVENOUS
  Filled 2015-04-23: qty 1000

## 2015-04-23 MED ORDER — SODIUM CHLORIDE 0.9 % IV SOLN: 250.0000 mL | INTRAVENOUS | Status: DC | PRN

## 2015-04-23 NOTE — Progress Notes (Signed)
[name] [name] MRN: [id number]  Subjective: -Care assumed of 34y.o. G3P1011 at 41.5wks who presents for IOL, but found to be in active labor.  Patient resting in bed.  Reports some perception of contractions, but admits they have decreased in frequency.  Patient does not desire pitocin, at current, but is open to idea of AROM.  Patient expresses concern regarding Vit K injection.   Objective: BP 124/84 mmHg  Pulse 79  Temp(Src) 99.3 F (37.4 C) (Oral)  Resp 18  Ht 5\' 5"  (1.651 m)  Wt 90.719 kg (200 lb)  BMI 33.28 kg/m2  SpO2 100%  LMP 07/05/2014     FHT: 130 bpm, Mod Var, -Decels, +Accels UC: Q7-38min, palpates mild   SVE:   Dilation: 6 Effacement (%): 80 Station: -3 Exam by:: J.Dorwin Fitzhenry, CNM Membranes:AROM of large amt clear fluid Pitocin:None  Assessment:  IUP at 41.5wks Cat I FT  Labor Augmentation Non-progressive Active Phase Labor  Plan: -Discussed r/b of AROM including cord prolapse, increased risk of infection, and decreased labor time. -Patient verbalizes understanding and agrees to AROM--tolerated well -Questions, concerns, and fears addressed regarding patient's decision to decline neonatal vitamin k -Informed that circumcision can not be performed, by [place] physicians, without said injection---patient verbalizes understanding -Will continue to monitor -Continue other mgmt as ordered  [facility], [name] Kirk LYNN,MSN, CNM 04/23/2015, 8:51 PM

## 2015-04-23 NOTE — H&P (Signed)
[name] [name] is a 34 y.o. female, G3 P1 at 41.5 weeks   Patient Active Problem List   Diagnosis Date Noted  . Pregnancy 04/23/2015  . OTHER SPECIFIED DISEASES DUE OTHER MYCOBACTERIA 03/06/2008  . ABDOMINAL PAIN, RECURRENT 02/05/2008    Pregnancy Course: Patient entered care at 12.5 weeks.   EDC of 04/09/15 was established by [place].      [place] evaluations:   12.5 weeks - Viability: fhr 151, S=D, subchorionic collection, fibroids,   19.4ks - Anatomy:EFW 10oz - 39.3%, breech, anterior placenta, female, fibroids    Significant prenatal events:   Fibroids, GBS+, Hx to SAB x2, vitaman  D deficiency,  Last evaluation:   41.3 weeks   VE:3/50/-3 on  04/06/15  Reason for admission:  IOL for post dates  Pt States:   Contractions Frequency: 5-6 minutes         Contraction severity: moderate         Fetal activity: +FM  OB History    Gravida Para Term Preterm AB TAB SAB Ectopic Multiple Living   3 1 1  1  1   1      Past Medical History  Diagnosis Date  . Fibroid   . IBS (irritable bowel syndrome)   . Chlamydia 2009  . Anemia   . Gestational diabetes     first preg   Past Surgical History  Procedure Laterality Date  . Appendectomy  1995   Family History: family history includes Diabetes in her maternal aunt, maternal grandmother, maternal uncle, mother, and paternal uncle; Hypertension in her maternal grandfather, maternal grandmother, mother, and sister. Social History:  reports that she has never smoked. She has never used smokeless tobacco. She reports that she drinks alcohol. She reports that she does not use illicit drugs.   Prenatal Transfer Tool  Maternal Diabetes: No Genetic Screening: Declined Maternal Ultrasounds/Referrals: Normal Fetal Ultrasounds or other Referrals:  None Maternal Substance Abuse:  No Significant Maternal Medications:  None Significant Maternal Lab Results: Lab values include: Group B Strep positive   ROS:  See HPI above, all other systems are  negative  Allergies  Allergen Reactions  . Percocet [Oxycodone-Acetaminophen] Other (See Comments)    Dizziness,  Confusion, rapid heartbeat   VE 4-5/60/-3  Blood pressure 113/81, pulse 76, temperature 98.1 F (36.7 C), temperature source Oral, resp. rate 18, height 5\' 5"  (1.651 m), weight 200 lb (90.719 kg), last menstrual period 07/05/2014.  Maternal Exam:  Uterine Assessment: Contraction frequency is rare.  Abdomen: Gravid, non tender. Fundal height is aga.  Normal external genitalia, vulva, cervix, uterus and adnexa.  No lesions noted on exam.  Pelvis adequate for delivery.  Fetal presentation: Vertex by on maternal left by US  Fetal Exam:  Monitor Surveillance : Intermitting monitoring Mode: Ultrasound.  NICHD: Category 1 CTXs: Q 6-91minutes EFW   8 lbs  Physical Exam: Nursing note and vitals reviewed General: alert and cooperative She appears well nourished Psychiatric: Normal mood and affect. Her behavior is normal Head: Normocephalic Eyes: Pupils are equal, round, and reactive to light Neck: Normal range of motion Cardiovascular: RRR without murmur  Respiratory: CTAB. Effort normal  Abd: soft, non-tender, +BS, no rebound, no guarding  Genitourinary: Vagina normal  Neurological: A&Ox3 Skin: Warm and dry  Musculoskeletal: Normal range of motion  Homan's sign negative bilaterally No evidence of DVTs.  Edema: Minimal bilaterally non-pitting edema DTR: 2+ Clonus: None   Prenatal labs: ABO, Rh: O/Positive/-- (11/04 0000) Antibody: Negative (11/04 0000) Rubella:  non Immune RPR: NON REAC (10/22 1840)  HBsAg: Negative (11/04 0000)  HIV: NONREACTIVE (10/22 1840)  GBS: Positive (05/09 0000) Sickle cell/Hgb electrophoresis:  WNL Pap:  wnl 10/06/15 GC:   negative Chlamydia: negative Genetic screenings:   Glucola:    Assessment:  IUP at 41.5 weeks NICHD: Category 1 Membranes: BBW Bishop Score: 7 GBS positive 4-5/60/-3 BBW, unable to feel  position   Plan:  Bedside US for placement Admit to L&D for expectant management of labor Walk and reassess 2 hour for cervical change Possible IOL d/t post dates IOL options reviewed with patient including AROM & pitocin R&B of IOL reviewed including serial induction, failure, and/or CS requirement Pt and family verbalize understanding and agree with treatment plan. GBS prophylaxis with AMP per Raven Harmes dosing d/t advance labor and hx of rapid labors. Okay to ambulate around unit with wireless monitors  Okay to get up and shower without monitoring  Regular diet prior to starting pitocin Clear/Thin diet after pitocin starts Continue with labor mgmt as ordered IV pain medication per orders PRN Epidural per patient request Foley cath after patient is comfortable with epidural Anticipate SVD   Attending MD available at all times.   Ananth Fiallos, CNM, MSN 04/23/2015, 4:13 PM

## 2015-04-23 NOTE — Progress Notes (Signed)
Addendum VE after walking 5-6/70/-3 ballotable con't with expectant managment

## 2015-04-24 ENCOUNTER — Encounter (HOSPITAL_COMMUNITY): Payer: Self-pay

## 2015-04-24 LAB — CBC
HEMATOCRIT: 26.1 % — AB (ref 36.0–46.0)
HEMOGLOBIN: 8.5 g/dL — AB (ref 12.0–15.0)
MCH: 24.7 pg — AB (ref 26.0–34.0)
MCHC: 32.6 g/dL (ref 30.0–36.0)
MCV: 75.9 fL — AB (ref 78.0–100.0)
Platelets: 217 10*3/uL (ref 150–400)
RBC: 3.44 MIL/uL — AB (ref 3.87–5.11)
RDW: 15.2 % (ref 11.5–15.5)
WBC: 17.3 10*3/uL — ABNORMAL HIGH (ref 4.0–10.5)

## 2015-04-24 LAB — RPR: RPR: NONREACTIVE

## 2015-04-24 MED ORDER — WITCH HAZEL-GLYCERIN EX PADS: 1.0000 "application " | MEDICATED_PAD | CUTANEOUS | Status: DC | PRN

## 2015-04-24 MED ORDER — ACETAMINOPHEN 325 MG PO TABS: 650.0000 mg | ORAL_TABLET | ORAL | Status: DC | PRN

## 2015-04-24 MED ORDER — ZOLPIDEM TARTRATE 5 MG PO TABS: 5.0000 mg | ORAL_TABLET | Freq: Every evening | ORAL | Status: DC | PRN

## 2015-04-24 MED ORDER — LORATADINE 10 MG PO TABS: 10.0000 mg | ORAL_TABLET | Freq: Every day | ORAL | Status: DC | PRN

## 2015-04-24 MED ORDER — TETANUS-DIPHTH-ACELL PERTUSSIS 5-2.5-18.5 LF-MCG/0.5 IM SUSP: 0.5000 mL | Freq: Once | INTRAMUSCULAR | Status: DC

## 2015-04-24 MED ORDER — SIMETHICONE 80 MG PO CHEW: 80.0000 mg | CHEWABLE_TABLET | ORAL | Status: DC | PRN

## 2015-04-24 MED ORDER — DIPHENHYDRAMINE HCL 25 MG PO CAPS: 25.0000 mg | ORAL_CAPSULE | Freq: Four times a day (QID) | ORAL | Status: DC | PRN

## 2015-04-24 MED ORDER — SENNOSIDES-DOCUSATE SODIUM 8.6-50 MG PO TABS
2.0000 | ORAL_TABLET | ORAL | Status: DC
  Administered 2015-04-24 – 2015-04-25 (×2): 2 via ORAL
  Filled 2015-04-24: qty 2

## 2015-04-24 MED ORDER — ONDANSETRON HCL 4 MG/2ML IJ SOLN: 4.0000 mg | INTRAMUSCULAR | Status: DC | PRN

## 2015-04-24 MED ORDER — TRAMADOL HCL 50 MG PO TABS: 50.0000 mg | ORAL_TABLET | Freq: Four times a day (QID) | ORAL | Status: DC | PRN

## 2015-04-24 MED ORDER — BENZOCAINE-MENTHOL 20-0.5 % EX AERO: 1.0000 "application " | INHALATION_SPRAY | CUTANEOUS | Status: DC | PRN

## 2015-04-24 MED ORDER — DIBUCAINE 1 % RE OINT: 1.0000 "application " | TOPICAL_OINTMENT | RECTAL | Status: DC | PRN

## 2015-04-24 MED ORDER — PRENATAL MULTIVITAMIN CH
1.0000 | ORAL_TABLET | Freq: Every day | ORAL | Status: DC
  Filled 2015-04-24: qty 1

## 2015-04-24 MED ORDER — IBUPROFEN 600 MG PO TABS
600.0000 mg | ORAL_TABLET | Freq: Four times a day (QID) | ORAL | Status: DC
  Administered 2015-04-24 (×4): 600 mg via ORAL
  Filled 2015-04-24 (×4): qty 1

## 2015-04-24 MED ORDER — ONDANSETRON HCL 4 MG PO TABS: 4.0000 mg | ORAL_TABLET | ORAL | Status: DC | PRN

## 2015-04-24 MED ORDER — LANOLIN HYDROUS EX OINT
TOPICAL_OINTMENT | CUTANEOUS | Status: DC | PRN
Start: 2015-04-24 — End: 2015-04-25

## 2015-04-24 NOTE — Lactation Note (Signed)
This note was copied from the chart of [name] Birdella Sippel. Lactation Consultation Note  Initial visit made.  Breastfeeding consultation services and support information given to patient.  She states she breastfed her first baby for 2 years.  Mom is questioning if she has enough milk for baby.  Teaching done regarding colostrum and milk coming to volume.  Demonstrated hand expression and milk easily expressed.  Observed her easily latch baby to breast.  Instructed to hold him in close and use breast massage during feeding.  Mom voices feeling reassured.  Encouraged to call with concerns or assist prn.  Patient Name: [name] [name] [id number] Date: 04/24/2015 Reason for consult: Initial assessment   Maternal Data    Feeding Feeding Type: Breast Fed Length of feed: 30 min  LATCH Score/Interventions Latch: Grasps breast easily, tongue down, lips flanged, rhythmical sucking.  Audible Swallowing: A few with stimulation  Type of Nipple: Everted at rest and after stimulation  Comfort (Breast/Nipple): Soft / non-tender     Hold (Positioning): No assistance needed to correctly position infant at breast. Intervention(s): Breastfeeding basics reviewed;Support Pillows;Position options;Skin to skin  LATCH Score: 9  Lactation Tools Discussed/Used     Consult Status Consult Status: Follow-up Date: 04/25/15 Follow-up type: In-patient    [name] [name] 04/24/2015, 3:14 PM

## 2015-04-24 NOTE — Progress Notes (Signed)
RN called into room and asked for provider from [place] called and asked to come speak to her regarding recent episode of waking up three times and "drowning ". Pt stated ' I woke up up three times in the last hour and was "drowning" on my own saliva". 'I LOOKED THIS UP ON LINE [place], PULMONARY EDEMA, OF SINUS DRIP. I would like for someone to come and talk to me. It is very  Concerning". [name] [name], CNMW for [facility] I called and notified of event and pt. Request for provider to come. CNMW stated she would be right over.

## 2015-04-24 NOTE — Plan of Care (Signed)
Problem: Consults Goal: Skin Care Protocol Initiated - if Braden Score 18 or less If consults are not indicated, leave blank or document N/A  Outcome: Not Applicable Date Met:  19/59/74 Braden scale 21.

## 2015-04-24 NOTE — Progress Notes (Signed)
Subjective: Postpartum Day 1: Vaginal delivery, 1st degree perineal laceration Patient up ad lib, mild dizziness this am, but no syncope. Feeding:  Breast Contraceptive plan:  Declines  Declines Fe due to hx of constipation with use.  Objective: Vital signs in last 24 hours: Temp:  [98.1 F (36.7 C)-99.4 F (37.4 C)] 98.4 F (36.9 C) (06/18 0700) Pulse Rate:  [75-101] 77 (06/18 0700) Resp:  [16-20] 20 (06/18 0700) BP: (110-139)/(72-86) 115/78 mmHg (06/18 0700) SpO2:  [100 %] 100 % (06/18 0700) Weight:  [90.719 kg (200 lb)] 90.719 kg (200 lb) (06/17 1330)  Physical Exam:  General: alert Lochia: appropriate Uterine Fundus: firm Perineum: healing well DVT Evaluation: No evidence of DVT seen on physical exam. Negative Homan's sign.  CBC Latest Ref Rng 04/24/2015 04/23/2015 08/06/2014  WBC 4.0 - 10.5 K/uL 17.3(H) 8.5 11.3(H)  Hemoglobin 12.0 - 15.0 g/dL 8.5(L) 10.0(L) 12.6  Hematocrit 36.0 - 46.0 % 26.1(L) 31.2(L) 36.9  Platelets 150 - 400 K/uL 217 230 359   Orthostatics stable  Assessment/Plan: Status post vaginal delivery day 1. Anemia without hemodynamic instability Stable Continue current care. Recommended good fluid intake and Fe-foods list Plan for discharge tomorrow    Allena Katz 04/24/2015, 11:15 AM

## 2015-04-24 NOTE — Progress Notes (Signed)
Patient called out on call light feeling hot and dizzy. Vitals assessed, given cool rag for head, and a fan. She had previously turned down the temperature in the room. Patient stated she has not slept today during the day. Encouraged fluids and rest.

## 2015-04-24 NOTE — Progress Notes (Signed)
[name] [name]  Post Partum Day 1:S/P SVD  Subjective: Patient up ad lib, denies syncope or dizziness. Reports three separate incidents of waking up feeling like "I was drowning and gasping for air."  Patient regards symptoms to possible "increase in saliva, post-nasal drip, or a pulmonary embolism" per her research.  Patient states that she has never experienced such symptoms before and worries that "something is happening."  Patient does admit to dry, itchy throat, occasional cough, and "phlegm" production.   Objective: Filed Vitals:   04/24/15 0700 04/24/15 1338 04/24/15 1721 04/24/15 1748  BP: 115/78 109/64 101/56 116/63  Pulse: 77 81 96 83  Temp: 98.4 F (36.9 C) 97.8 F (36.6 C) 98.1 F (36.7 C) 98.2 F (36.8 C)  TempSrc: Oral Oral Oral Oral  Resp: 20 20 19 20   Height:      Weight:      SpO2: 100%   100%    Recent Labs  04/23/15 1340 04/24/15 0540  HGB 10.0* 8.5*  HCT 31.2* 26.1*    Physical Exam:  General: alert, cooperative and no distress Mood/Affect: Anxious HEENT:  Head; Normocephalic, atraumatic.  Eyes: PERLA.  ENT: Exam normal with exception of bilateral swelling of submandibular lymph nodes Lungs: clear to auscultation, no wheezes, rales or rhonchi, symmetric air entry.  Heart: normal rate and regular rhythm. Skin exam: Warm, Dry DVT: No edema of BLE, no pain or tenderness  Assessment S/P Vaginal Delivery-Day 1 Allergies BreastFeeding  Plan: Reassurances given  Discussed possibility of infection, allergies, and increased saliva production Agreed to treat symptoms, which appear allergy related Patient is hesitant, but agrees to medication for mgmt Assured that medication is safe during breastfeeding Claritin 10mg  daily prn Continue other care as ordered  [facility], Lezette Kitts Jeani Hawking, MSN, CNM 04/24/2015, 7:46 PM

## 2015-04-25 DIAGNOSIS — D62 Acute posthemorrhagic anemia: Secondary | ICD-10-CM

## 2015-04-25 DIAGNOSIS — D649 Anemia, unspecified: Secondary | ICD-10-CM

## 2015-04-25 MED ORDER — MEASLES, MUMPS & RUBELLA VAC ~~LOC~~ INJ: 0.5000 mL | INJECTION | Freq: Once | SUBCUTANEOUS | Status: DC

## 2015-04-25 NOTE — Clinical Documentation Improvement (Signed)
Please specify diagnosis related to below supporting documentation, if appropriate.   Possible Clinical Conditions? Iron deficiency Anemia  Acute Blood Loss Anemia Aplastic anemia Precipitous drop in Hematocrit Acute on chronic blood loss anemia Other Condition Cannot Clinically Determine   Supporting Information: Per 04/24/15 Provider Progress note= Anemia without hemodynamic instability Stable Continue current care. Recommended good fluid intake and Fe-foods list.  Per 04/23/15 H&P= Patient has a history of anemia.   Patient's labs during this admission Component     Latest Ref Rng 04/23/2015 04/24/2015  Hemoglobin     12.0 - 15.0 g/dL 10.0 (L) 8.5 (L)  HCT     36.0 - 46.0 % 31.2 (L) 26.1 (L)      Thank You, Serena Colonel ,RN Clinical Documentation Specialist:  [phone]  [facility]

## 2015-04-25 NOTE — Discharge Summary (Signed)
Vaginal Delivery Discharge Summary  [name] [name]  DOB:    10-Sep-1981 MRN:    [id number] CSN:    [id number]  Date of admission:                  04/23/15  Date of discharge:                   04/25/15  Procedures this admission:   SVB, repair of 1st degree perineal laceration  Date of Delivery: 04/23/15  Newborn Data:  Live born female  Birth Weight: 8 lb 4.1 oz (3745 g) APGAR: 8, 9  Home with mother. Name: [name] [name] Circumcision Plan:  May want as outpatient, but has declined Vit K.  Per Dr. Mancel Bale, she would do without Vit K having been received.  Patient to f/u with the office to discuss.  History of Present Illness:  [name] [name] is a 34 y.o. female, [id number], who presents at 34w5dweeks gestation. [facility] She was admitted for onset of labor. Her pregnancy has been complicated by:  Patient Active Problem List   Diagnosis Date Noted  . Anemia associated with acute blood loss--superimposed upon chronic anemia 04/25/2015  . Chronic anemia 04/25/2015  . Vaginal delivery 04/24/2015  . First degree perineal laceration during delivery 04/24/2015  . Pregnancy 04/23/2015  . OTHER SPECIFIED DISEASES DUE OTHER MYCOBACTERIA 03/06/2008  . ABDOMINAL PAIN, RECURRENT 02/05/2008     Hospital Course:   Admitting Dx:   IUP at 4825/7 weeks, GBS positive, chronic anemia GBS Status:  Positive Delivering Clinician: JGavin Pound CNM Lacerations/MLE: 1st degree perineal Complications: Anemia without hemodynamic instability Had episode on the evening of day 1 where she felt increased mucus in her throat, which woke her up and frightened her.  Seen by JGavin Pound CNM--vitals stable, physical exam WNL.  Started on Claritin for likely post-nasal drip causing sx.  Patient without issue on day 2.  Intrapartum Procedures: spontaneous vaginal delivery Postpartum Procedures: MMR  vaccine offered Complications-Operative and Postpartum: 1st degree perineal laceration and anemia without hemodynamic instability  Discharge Diagnoses: Term Pregnancy-delivered and chronic anemia with superimposed anemia due to acute blood loss, GBS positive  Feeding:  breast  Contraception:  no method--declines.  Hemoglobin Results:  CBC Latest Ref Rng 04/24/2015 04/23/2015 08/06/2014  WBC 4.0 - 10.5 K/uL 17.3(H) 8.5 11.3(H)  Hemoglobin 12.0 - 15.0 g/dL 8.5(L) 10.0(L) 12.6  Hematocrit 36.0 - 46.0 % 26.1(L) 31.2(L) 36.9  Platelets 150 - 400 K/uL 217 230 359    Discharge Physical Exam:   General: alert Lochia: appropriate Uterine Fundus: firm Incision: healing well DVT Evaluation: No evidence of DVT seen on physical exam. Negative Homan's sign.   Discharge Information:  Activity:           pelvic rest Diet:                routine Medications: Declined any Rx meds--may use OTC Claritin or Zyrtec for increased mucus. Condition:      stable Instructions:  Routine pp instructions   Discharge to: home  Follow-up Information    [facility] Schedule an appointment as soon as possible for a visit in 6 weeks.   Specialty:  Obstetrics and Gynecology   Why:  Call for any questions or concerns.   Contact information:   [address] [place]  [phone]  [place], [place] 04/25/2015 9:06 AM

## 2015-04-25 NOTE — Discharge Instructions (Signed)
May continue to use Claritin or Zyrtec as over-the-counter medications for increased mucus production as desired.  Postpartum Care After Vaginal Delivery After you deliver your newborn (postpartum period), the usual stay in the hospital is 24-72 hours. If there were problems with your labor or delivery, or if you have other medical problems, you might be in the hospital longer.  While you are in the hospital, you will receive help and instructions on how to care for yourself and your newborn during the postpartum period.  While you are in the hospital:  Be sure to tell your nurses if you have pain or discomfort, as well as where you feel the pain and what makes the pain worse.  If you had an incision made near your vagina (episiotomy) or if you had some tearing during delivery, the nurses may put ice packs on your episiotomy or tear. The ice packs may help to reduce the pain and swelling.  If you are breastfeeding, you may feel uncomfortable contractions of your uterus for a couple of weeks. This is normal. The contractions help your uterus get back to normal size.  It is normal to have some bleeding after delivery.  For the first 1-3 days after delivery, the flow is red and the amount may be similar to a period.  It is common for the flow to start and stop.  In the first few days, you may pass some small clots. Let your nurses know if you begin to pass large clots or your flow increases.  Do not  flush blood clots down the toilet before having the nurse look at them.  During the next 3-10 days after delivery, your flow should become more watery and pink or brown-tinged in color.  Ten to fourteen days after delivery, your flow should be a small amount of yellowish-white discharge.  The amount of your flow will decrease over the first few weeks after delivery. Your flow may stop in 6-8 weeks. Most women have had their flow stop by 12 weeks after delivery.  You should change your sanitary  pads frequently.  Wash your hands thoroughly with soap and water for at least 20 seconds after changing pads, using the toilet, or before holding or feeding your newborn.  You should feel like you need to empty your bladder within the first 6-8 hours after delivery.  In case you become weak, lightheaded, or faint, call your nurse before you get out of bed for the first time and before you take a shower for the first time.  Within the first few days after delivery, your breasts may begin to feel tender and full. This is called engorgement. Breast tenderness usually goes away within 48-72 hours after engorgement occurs. You may also notice milk leaking from your breasts. If you are not breastfeeding, do not stimulate your breasts. Breast stimulation can make your breasts produce more milk.  Spending as much time as possible with your newborn is very important. During this time, you and your newborn can feel close and get to know each other. Having your newborn stay in your room (rooming in) will help to strengthen the bond with your newborn. It will give you time to get to know your newborn and become comfortable caring for your newborn.  Your hormones change after delivery. Sometimes the hormone changes can temporarily cause you to feel sad or tearful. These feelings should not last more than a few days. If these feelings last longer than that, you should talk  to your caregiver.  If desired, talk to your caregiver about methods of family planning or contraception.  Talk to your caregiver about immunizations. Your caregiver may want you to have the following immunizations before leaving the hospital:  Tetanus, diphtheria, and pertussis (Tdap) or tetanus and diphtheria (Td) immunization. It is very important that you and your family (including grandparents) or others caring for your newborn are up-to-date with the Tdap or Td immunizations. The Tdap or Td immunization can help protect your newborn  from getting ill.  Rubella immunization.  Varicella (chickenpox) immunization.  Influenza immunization. You should receive this annual immunization if you did not receive the immunization during your pregnancy. Document Released: 08/20/2007 Document Revised: 07/17/2012 Document Reviewed: 06/19/2012 [facility] Patient Information 2015 [place], [place]. This information is not intended to replace advice given to you by your health care provider. Make sure you discuss any questions you have with your health care provider.  Iron-Rich Diet An iron-rich diet contains foods that are good sources of iron. Iron is an important mineral that helps your body produce hemoglobin. Hemoglobin is a protein in red blood cells that carries oxygen to the body's tissues. Sometimes, the iron level in your blood can be low. This may be caused by:  A lack of iron in your diet.  Blood loss.  Times of growth, such as during pregnancy or during a child's growth and development. Low levels of iron can cause a decrease in the number of red blood cells. This can result in iron deficiency anemia. Iron deficiency anemia symptoms include:  Tiredness.  Weakness.  Irritability.  Increased chance of infection. Here are some recommendations for daily iron intake:  Males older than 33 years of age need 8 mg of iron per day.  Women ages 29 to 12 need 18 mg of iron per day.  Pregnant women need 27 mg of iron per day, and women who are over 39 years of age and breastfeeding need 9 mg of iron per day.  Women over the age of 3 need 8 mg of iron per day. SOURCES OF IRON There are 2 types of iron that are found in food: heme iron and nonheme iron. Heme iron is absorbed by the body better than nonheme iron. Heme iron is found in meat, poultry, and fish. Nonheme iron is found in grains, beans, and vegetables. Heme Iron Sources Food / Iron (mg)  Chicken liver, 3 oz (85 g)/ 10 mg  Beef liver, 3 oz (85 g)/ 5.5 mg  Oysters,  3 oz (85 g)/ 8 mg  Beef, 3 oz (85 g)/ 2 to 3 mg  Shrimp, 3 oz (85 g)/ 2.8 mg  [place], 3 oz (85 g)/ 2 mg  Chicken, 3 oz (85 g) / 1 mg  Fish (tuna, halibut), 3 oz (85 g)/ 1 mg  Pork, 3 oz (85 g)/ 0.9 mg Nonheme Iron Sources Food / Iron (mg)  Ready-to-eat breakfast cereal, iron-fortified / 3.9 to 7 mg  Tofu,  cup / 3.4 mg  Kidney beans,  cup / 2.6 mg  Baked potato with skin / 2.7 mg  Asparagus,  cup / 2.2 mg  Avocado / 2 mg  Dried peaches,  cup / 1.6 mg  Raisins,  cup / 1.5 mg  Soy milk, 1 cup / 1.5 mg  Whole-wheat bread, 1 slice / 1.2 mg  Spinach, 1 cup / 0.8 mg  Broccoli,  cup / 0.6 mg IRON ABSORPTION Certain foods can decrease the body's absorption of iron. Try  to avoid these foods and beverages while eating meals with iron-containing foods:  Coffee.  Tea.  Fiber.  Soy. Foods containing vitamin C can help increase the amount of iron your body absorbs from iron sources, especially from nonheme sources. Eat foods with vitamin C along with iron-containing foods to increase your iron absorption. Foods that are high in vitamin C include many fruits and vegetables. Some good sources are:  Fresh orange juice.  Oranges.  Strawberries.  Mangoes.  Grapefruit.  Red bell peppers.  Green bell peppers.  Broccoli.  Potatoes with skin.  Tomato juice. Document Released: 06/06/2005 Document Revised: 01/15/2012 Document Reviewed: 04/13/2011 [facility] Patient Information 2015 [place], [place]. This information is not intended to replace advice given to you by your health care provider. Make sure you discuss any questions you have with your health care provider.

## 2015-10-06 IMAGING — US US OB COMP LESS 14 WK
1 series · 14 of 28 positions shown · non-contrast
Comparison: None.

CLINICAL DATA: Pregnant, abdominal pain, beta HCG 3333

EXAM:
OBSTETRIC <14 WK US AND TRANSVAGINAL OB US
TECHNIQUE: Both transabdominal and transvaginal ultrasound examinations were
performed for complete evaluation of the gestation as well as the
maternal uterus, adnexal regions, and pelvic cul-de-sac.
Transvaginal technique was performed to assess early pregnancy.

[Series 1: us ob comp less 14 wks · 14 of 58 slices shown]
[im 3/58]
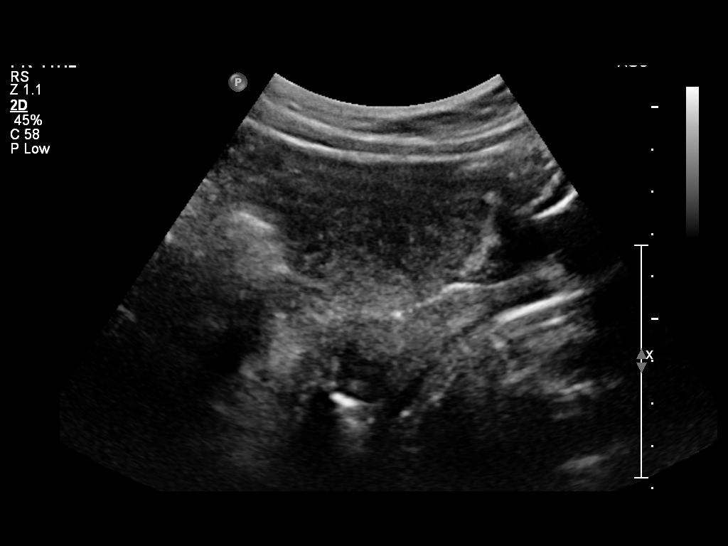
[im 7/58]
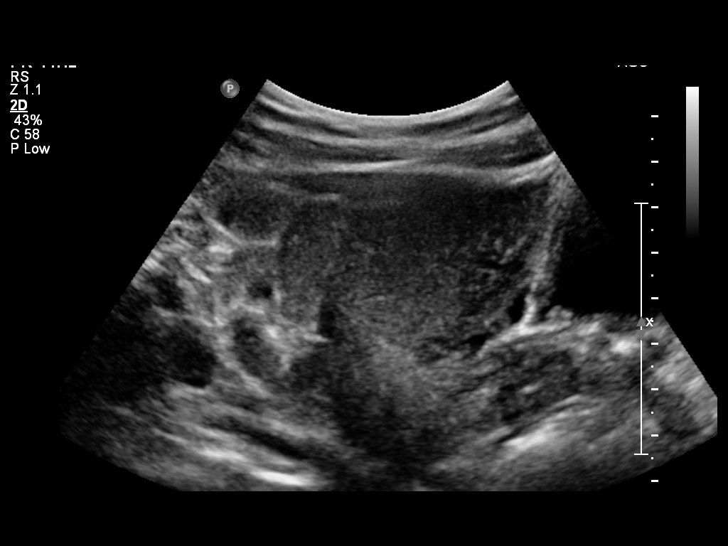
[im 11/58]
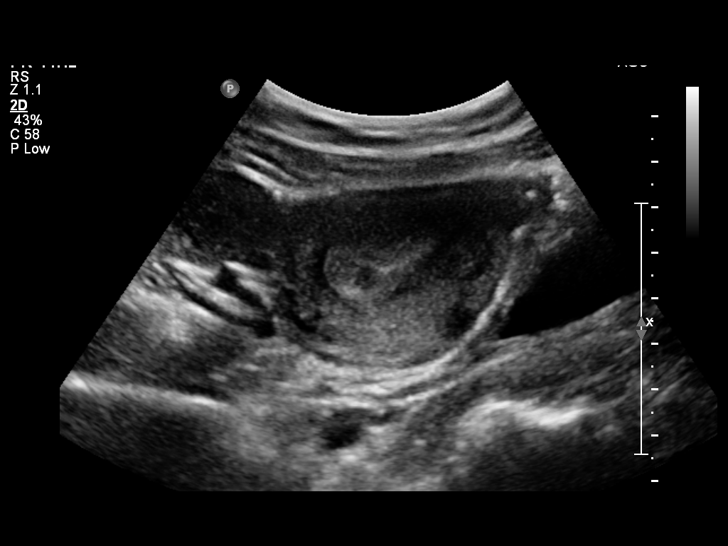
[im 15/58]
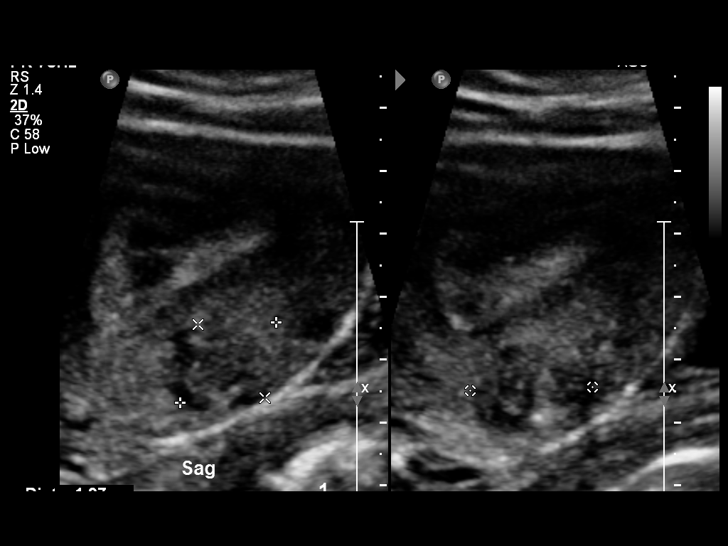
[im 20/58]
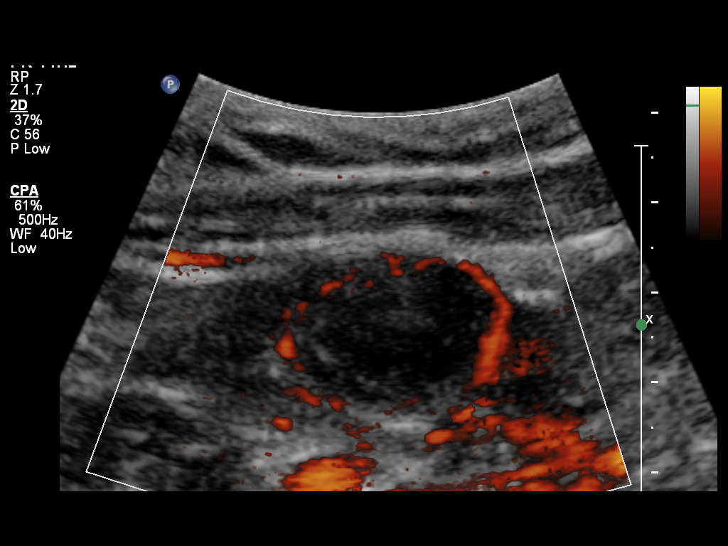
[im 24/58]
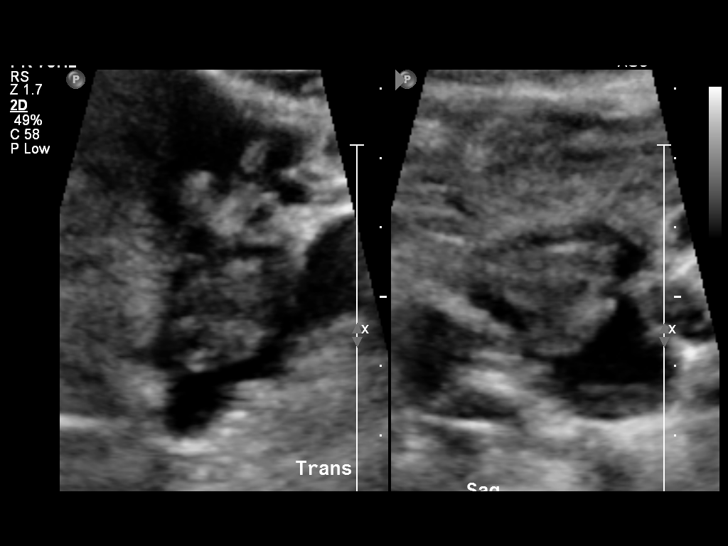
[im 28/58]
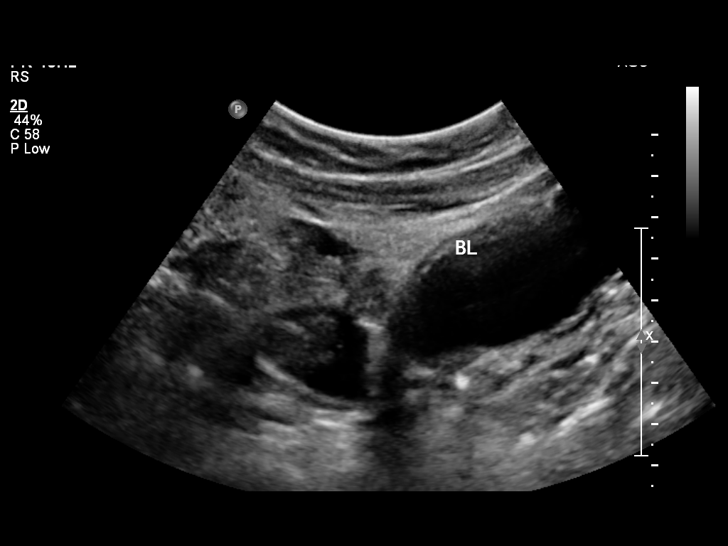
[im 32/58]
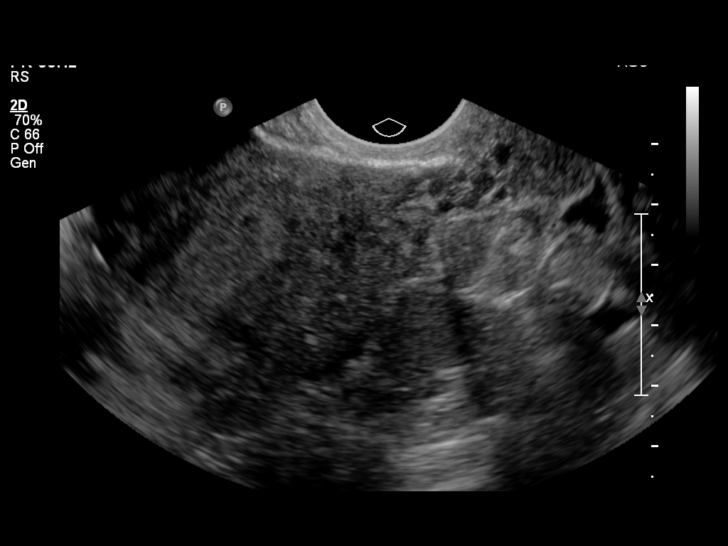
[im 36/58]
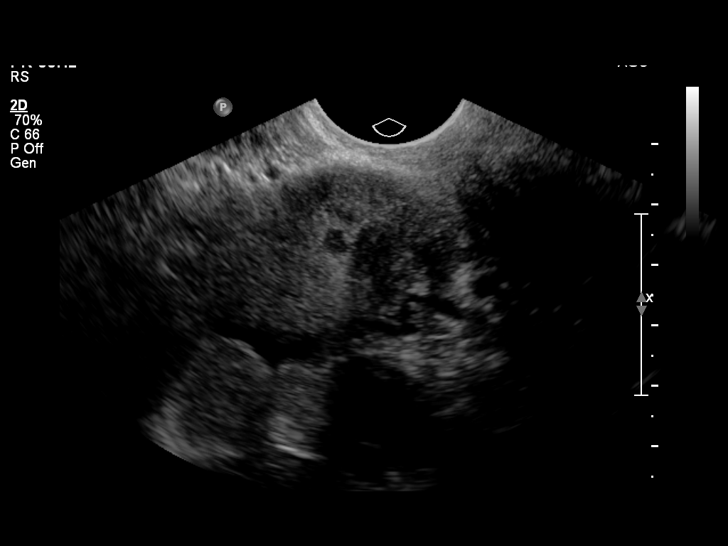
[im 41/58]
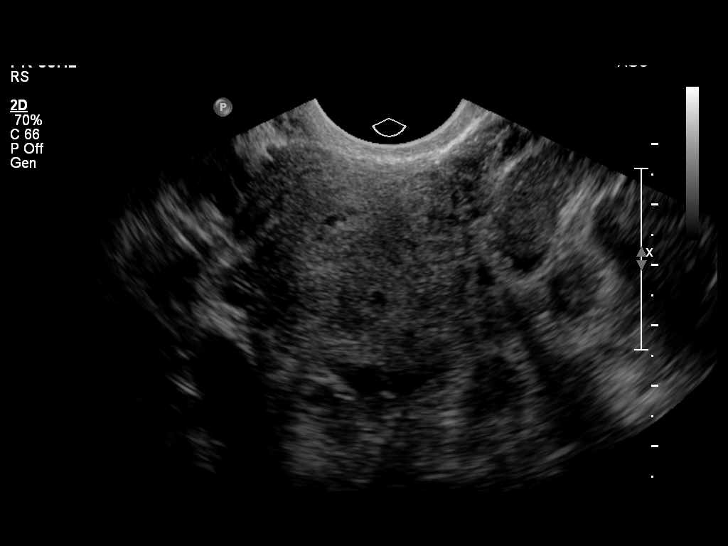
[im 45/58]
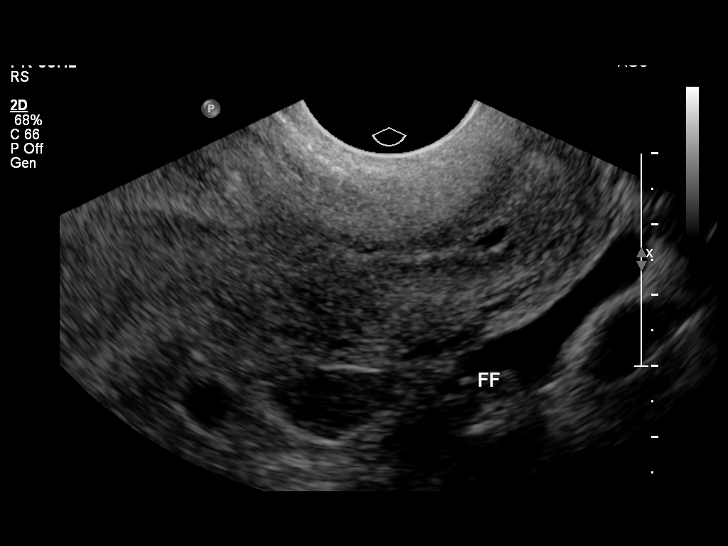
[im 49/58]
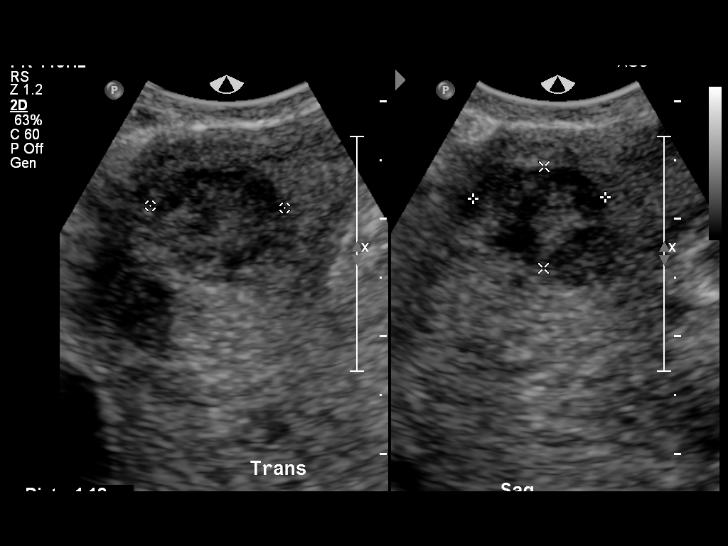
[im 53/58]
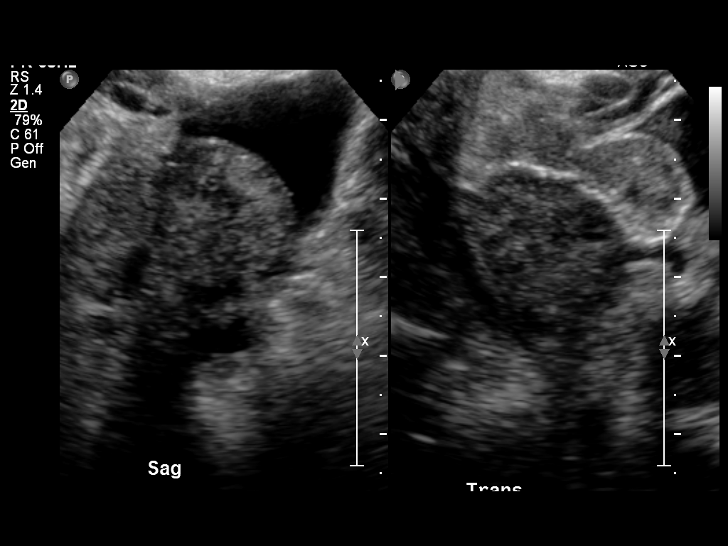
[im 58/58]
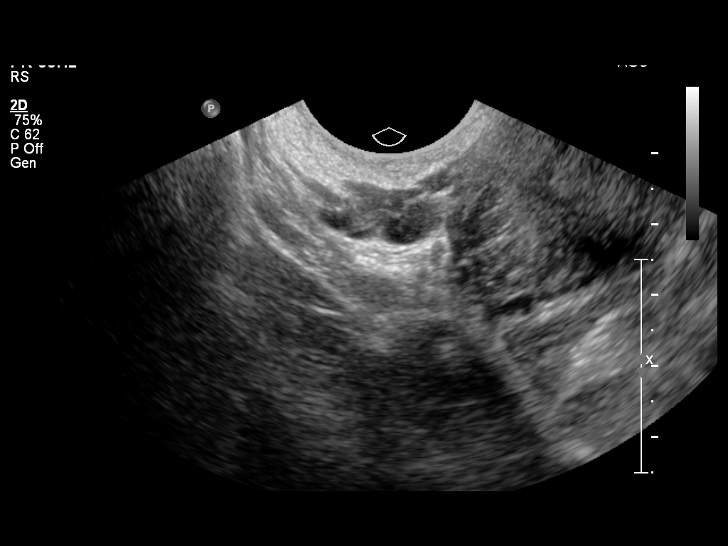

[14 of 28 positions shown; findings below may reference images not displayed]

FINDINGS: Intrauterine gestational sac: Visualized/normal in shape.

Yolk sac:  Not visualized

Embryo:  Not visualized

MSD:  3.4  mm   4 w   6  d

US EDC: 04/09/2015

Maternal uterus/adnexae: No subarachnoid hemorrhage.

Two uterine fibroids measuring up to 11 mm.

Left ovary is within normal limits, measuring 2.8 x 1.7 x 2.0 cm.

Right ovary measures 3.7 x 1.9 x 2.3 cm and is notable for a corpus
luteal cyst.

Trace pelvic ascites.
IMPRESSION: Probable intrauterine gestational sac.  No yolk sac or fetal pole.

Serial beta HCG is suggested. Consider follow-up pelvic ultrasound
in 14 days to confirm from viability.

## 2016-02-03 ENCOUNTER — Other Ambulatory Visit: Payer: Self-pay | Admitting: Obstetrics and Gynecology

## 2016-02-03 DIAGNOSIS — R102 Pelvic and perineal pain: Secondary | ICD-10-CM

## 2016-03-07 ENCOUNTER — Ambulatory Visit
Admission: RE | Admit: 2016-03-07 | Discharge: 2016-03-07 | Disposition: A | Discharge status: Home / Self Care | Payer: Medicaid Other | Source: Ambulatory Visit | Attending: Obstetrics and Gynecology | Admitting: Obstetrics and Gynecology

## 2016-03-07 DIAGNOSIS — R102 Pelvic and perineal pain: Secondary | ICD-10-CM

## 2016-03-07 MED ORDER — IOPAMIDOL (ISOVUE-300) INJECTION 61%
100.0000 mL | Freq: Once | INTRAVENOUS | Status: AC | PRN
  Administered 2016-03-07: 100 mL via INTRAVENOUS

## 2017-02-20 ENCOUNTER — Encounter (HOSPITAL_COMMUNITY): Payer: Self-pay

## 2017-06-26 ENCOUNTER — Encounter (HOSPITAL_BASED_OUTPATIENT_CLINIC_OR_DEPARTMENT_OTHER): Payer: Self-pay

## 2017-06-26 ENCOUNTER — Emergency Department (HOSPITAL_BASED_OUTPATIENT_CLINIC_OR_DEPARTMENT_OTHER)
Admission: EM | Admit: 2017-06-26 | Discharge: 2017-06-26 | Disposition: A | Discharge status: Home / Self Care | Payer: Managed Care, Other (non HMO) | Attending: Emergency Medicine | Admitting: Emergency Medicine

## 2017-06-26 DIAGNOSIS — R1032 Left lower quadrant pain: Secondary | ICD-10-CM | POA: Insufficient documentation

## 2017-06-26 DIAGNOSIS — Z79899 Other long term (current) drug therapy: Secondary | ICD-10-CM | POA: Insufficient documentation

## 2017-06-26 DIAGNOSIS — R109 Unspecified abdominal pain: Secondary | ICD-10-CM | POA: Diagnosis present

## 2017-06-26 LAB — URINALYSIS, ROUTINE W REFLEX MICROSCOPIC
Bilirubin Urine: NEGATIVE
Glucose, UA: NEGATIVE mg/dL
KETONES UR: NEGATIVE mg/dL
LEUKOCYTES UA: NEGATIVE
Nitrite: NEGATIVE
PH: 7 (ref 5.0–8.0)
Protein, ur: NEGATIVE mg/dL
Specific Gravity, Urine: 1.023 (ref 1.005–1.030)

## 2017-06-26 LAB — URINALYSIS, MICROSCOPIC (REFLEX): WBC, UA: NONE SEEN WBC/hpf (ref 0–5)

## 2017-06-26 LAB — PREGNANCY, URINE: Preg Test, Ur: NEGATIVE

## 2017-06-26 NOTE — ED Notes (Signed)
Pt verbalizes understanding of d/c instructions and denies any further needs at this time. 

## 2017-06-26 NOTE — Discharge Instructions (Signed)

## 2017-06-26 NOTE — ED Provider Notes (Signed)
[facility]PT [facility] Provider Note   CSN: [id number] Arrival date & time: 06/26/17  1933     History   Chief Complaint Chief Complaint  Patient presents with  . Flank Pain    HPI [name] [name] is a 36 y.o. female.  The history is provided by the patient.  Flank Pain  This is a recurrent problem. Episode onset: 1 month. Episode frequency: intermittent. The problem has been resolved (Currently asymptomatic). Pertinent negatives include no chest pain, no abdominal pain, no headaches and no shortness of breath. Nothing aggravates the symptoms. Nothing relieves the symptoms. She has tried nothing for the symptoms.   Told she had a kidney stone (or gallstone - she can't remember - by OB/Gyn last yr).    Past Medical History:  Diagnosis Date  . Anemia   . Chlamydia 2009  . Fibroid   . Gestational diabetes    first preg  . IBS (irritable bowel syndrome)     Patient Active Problem List   Diagnosis Date Noted  . Anemia associated with acute blood loss--superimposed upon chronic anemia 04/25/2015  . Chronic anemia 04/25/2015  . Vaginal delivery 04/24/2015  . First degree perineal laceration during delivery 04/24/2015  . Pregnancy 04/23/2015  . OTHER SPECIFIED DISEASES DUE OTHER MYCOBACTERIA 03/06/2008  . ABDOMINAL PAIN, RECURRENT 02/05/2008    Past Surgical History:  Procedure Laterality Date  . APPENDECTOMY  1995    OB History    Gravida Para Term Preterm AB Living   3 2 2   1 2    SAB TAB Ectopic Multiple Live Births   1     0 2       Home Medications    Prior to Admission medications   Medication Sig Start Date End Date Taking? Authorizing Provider  cholecalciferol (VITAMIN D) 1000 UNITS tablet Take 4,000 Units by mouth daily.    [provider]  OVER THE COUNTER MEDICATION Take 1 scoop by mouth daily. Powdered multivitamin (adds to food)    [provider]    Family History Family History  Problem Relation Age of Onset  .  Hypertension Mother   . Diabetes Mother   . Hypertension Sister   . Diabetes Maternal Aunt   . Diabetes Maternal Uncle   . Diabetes Paternal Uncle   . Hypertension Maternal Grandmother   . Diabetes Maternal Grandmother   . Hypertension Maternal Grandfather     Social History Social History  Substance Use Topics  . Smoking status: Never Smoker  . Smokeless tobacco: Never Used  . Alcohol use Yes     Comment: occassionally when not pregnant     Allergies   Percocet [oxycodone-acetaminophen]   Review of Systems Review of Systems  Constitutional: Negative for chills and fever.  HENT: Negative for ear pain and sore throat.   Eyes: Negative for pain and visual disturbance.  Respiratory: Negative for cough and shortness of breath.   Cardiovascular: Negative for chest pain and palpitations.  Gastrointestinal: Positive for constipation (chronic) and nausea. Negative for abdominal pain, blood in stool and vomiting.  Genitourinary: Positive for flank pain and vaginal bleeding (currently on her menstrual cycle). Negative for dysuria, hematuria and vaginal discharge.  Musculoskeletal: Negative for arthralgias and back pain.  Skin: Negative for color change and rash.  Neurological: Negative for seizures, syncope and headaches.  All other systems reviewed and are negative.    Physical Exam Updated Vital Signs BP 110/79 (BP Location: Right Arm)   Pulse 73  Temp 98.4 F (36.9 C) (Oral)   Resp 18   Ht 5\' 5"  (1.651 m)   Wt 85.7 kg (189 lb)   LMP 06/24/2017   SpO2 100%   BMI 31.45 kg/m   Physical Exam  Constitutional: She is oriented to person, place, and time. She appears well-developed and well-nourished. No distress.  HENT:  Head: Normocephalic and atraumatic.  Nose: Nose normal.  Eyes: Pupils are equal, round, and reactive to light. Conjunctivae and EOM are normal. Right eye exhibits no discharge. Left eye exhibits no discharge. No scleral icterus.  Neck: Normal range of  motion. Neck supple.  Cardiovascular: Normal rate and regular rhythm.  Exam reveals no gallop and no friction rub.   No murmur heard. Pulmonary/Chest: Effort normal and breath sounds normal. No stridor. No respiratory distress. She has no rales.  Abdominal: Soft. She exhibits no distension. There is no tenderness.  Dull to percussion about the descending colon.  Musculoskeletal: She exhibits no edema or tenderness.  Neurological: She is alert and oriented to person, place, and time.  Skin: Skin is warm and dry. No rash noted. She is not diaphoretic. No erythema.  Psychiatric: She has a normal mood and affect.  Vitals reviewed.    ED Treatments / Results  Labs (all labs ordered are listed, but only abnormal results are displayed) Labs Reviewed  URINALYSIS, ROUTINE W REFLEX MICROSCOPIC - Abnormal; Notable for the following:       Result Value   Hgb urine dipstick LARGE (*)    All other components within normal limits  URINALYSIS, MICROSCOPIC (REFLEX) - Abnormal; Notable for the following:    Bacteria, UA RARE (*)    Squamous Epithelial / LPF 0-5 (*)    All other components within normal limits  PREGNANCY, URINE    EKG  EKG Interpretation None       Radiology No results found.  Procedures Procedures (including critical care time)  Medications Ordered in ED Medications - No data to display   Initial Impression / Assessment and Plan / ED Course  I have reviewed the triage vital signs and the nursing notes.  Pertinent labs & imaging results that were available during my care of the patient were reviewed by me and considered in my medical decision making (see chart for details).     Intermittent left-sided abdominal pain. Abdomen benign. Currently asymptomatic. History of constipation. UPT negative. UA obtained at triage revealed large blood but likely due to contamination from vaginal bleeding from her menstrual cycle, no evidence of urinary tract infection. Low  suspicion for PID, TOA, ovarian torsion.  Given her history of constipation this may be related. Also possible renal colic however patient is asymptomatic at this moment with no evidence of UTI.  No suspicion for serious intra-abdominal inflammatory/infectious process. Do not feel that labs or advanced imaging are necessary at this time.  The patient is safe for discharge with strict return precautions.   Final Clinical Impressions(s) / ED Diagnoses   Final diagnoses:  Left lower quadrant pain   Disposition: Discharge  Condition: Good  I have discussed the results, Dx and Tx plan with the patient who expressed understanding and agree(s) with the plan. Discharge instructions discussed at great length. The patient was given strict return precautions who verbalized understanding of the instructions. No further questions at time of discharge.    New Prescriptions   No medications on file    Follow Up: Colon Branch, MD [address]  [place] [address] [phone]  Schedule an appointment as soon as possible for a visit  If symptoms do not improve or  worsen      [place], Grayce Sessions, MD 06/26/17 2107

## 2017-06-26 NOTE — ED Triage Notes (Signed)
Pt c/o left flank pain for several months that got worse today with nausea, denies dysuria, denies hematuria

## 2019-05-22 ENCOUNTER — Ambulatory Visit: Payer: Self-pay

## 2019-05-22 NOTE — Telephone Encounter (Signed)
Patient called and says she's been having abdominal pain since yesterday off and on. She says it's in the lower right abdomen and goes up towards the chest. She says the pain is a 5 right now, but earlier was higher. She says she was told some years ago that she had IBS. She says she vomited yesterday once and no more vomiting since then. She says she is constipated. She says food, no matter how light, makes her stomach hurt worse. I advised since she has not been seen in the office since 2008, she will need to re-establish as a new patient, she says she is willing to do that. I advised to go to the ED, if pain becomes worse, advised someone from the office will call tomorrow to schedule an appointment, she verbalized understanding.  Reason for Disposition . [1] MODERATE pain (e.g., interferes with normal activities) AND [2] pain comes and goes (cramps) AND [3] present > 24 hours  (Exception: pain with Vomiting or Diarrhea - see that Guideline)  Answer Assessment - Initial Assessment Questions 1. LOCATION: "Where does it hurt?"      Right lower abdomen 2. RADIATION: "Does the pain shoot anywhere else?" (e.g., chest, back)     Goes to the chest  3. ONSET: "When did the pain begin?" (e.g., minutes, hours or days ago)      Yesterday morning 4. SUDDEN: "Gradual or sudden onset?"     Gradual 5. PATTERN "Does the pain come and go, or is it constant?"    - If constant: "Is it getting better, staying the same, or worsening?"      (Note: Constant means the pain never goes away completely; most serious pain is constant and it progresses)     - If intermittent: "How long does it last?" "Do you have pain now?"     (Note: Intermittent means the pain goes away completely between bouts)     Come and go 6. SEVERITY: "How bad is the pain?"  (e.g., Scale 1-10; mild, moderate, or severe)   - MILD (1-3): doesn't interfere with normal activities, abdomen soft and not tender to touch    - MODERATE (4-7): interferes  with normal activities or awakens from sleep, tender to touch    - SEVERE (8-10): excruciating pain, doubled over, unable to do any normal activities      5 7. RECURRENT SYMPTOM: "Have you ever had this type of abdominal pain before?" If so, ask: "When was the last time?" and "What happened that time?"      Yes, I was diagnosed with IBS 8. CAUSE: "What do you think is causing the abdominal pain?"     I don't know 9. RELIEVING/AGGRAVATING FACTORS: "What makes it better or worse?" (e.g., movement, antacids, bowel movement)     Food 10. OTHER SYMPTOMS: "Has there been any vomiting, diarrhea, constipation, or urine problems?"      Vomited yesterday, constipation 11. PREGNANCY: "Is there any chance you are pregnant?" "When was your last menstrual period?"       No, just ended period 2 weeks ago  Protocols used: [place]

## 2019-05-23 ENCOUNTER — Other Ambulatory Visit: Payer: Self-pay

## 2019-05-23 NOTE — Telephone Encounter (Signed)
Not taking new pt at this point.

## 2019-05-23 NOTE — Telephone Encounter (Signed)
Dr. Larose Kells not accepting new patients- okay to schedule with a provider who is.

## 2019-05-28 ENCOUNTER — Encounter: Payer: Self-pay | Admitting: Family Medicine

## 2019-05-28 ENCOUNTER — Other Ambulatory Visit: Payer: Self-pay

## 2019-05-28 ENCOUNTER — Ambulatory Visit (INDEPENDENT_AMBULATORY_CARE_PROVIDER_SITE_OTHER): Payer: Self-pay | Admitting: Family Medicine

## 2019-05-28 DIAGNOSIS — K582 Mixed irritable bowel syndrome: Secondary | ICD-10-CM | POA: Insufficient documentation

## 2019-05-28 MED ORDER — DICYCLOMINE HCL 10 MG PO CAPS: 10.0000 mg | ORAL_CAPSULE | Freq: Three times a day (TID) | ORAL | 1 refills | Status: DC

## 2019-05-28 NOTE — Progress Notes (Signed)
Chief Complaint  Patient presents with  . New Patient (Initial Visit)       New Patient Visit SUBJECTIVE: HPI: [name] [name] is an 38 y.o.female who is being seen for re-establishing care. Due to COVID-19 pandemic, we are interacting via web portal for an electronic face-to-face visit. I verified patient's ID using 2 identifiers. Patient agreed to proceed with visit via this method. Patient is at home, I am at office. Patient and I are present for visit.   The patient has not had pcp in many years  2007 has had GI issues, had been doing well since Aug of 2019. Marland Kitchen Stress exacerbates. Too much of certain foods can worsen distress. Bloating, gas, upset stomach, cramping are s/s's. Had seen Dr. Ardis Hughs of the GI team who dx'd her w IBS.  Neg colonoscopy/EGD. Will have 3 instances/week lasting 24-36 hrs on average. +loose stools, constipation that alternates, urgency, abd pain. Denies bleeding, nighttime awakenings, wt loss, fevers.   Allergies  Allergen Reactions  . Percocet [Oxycodone-Acetaminophen] Other (See Comments)    Dizziness,  Confusion, rapid heartbeat    Past Medical History:  Diagnosis Date  . Anemia   . Chlamydia 2009  . Fibroid   . Gestational diabetes    first preg  . IBS (irritable bowel syndrome)    Family History  Problem Relation Age of Onset  . Hypertension Mother   . Diabetes Mother   . Hypertension Sister   . Diabetes Maternal Aunt   . Diabetes Maternal Uncle   . Diabetes Paternal Uncle   . Hypertension Maternal Grandmother   . Diabetes Maternal Grandmother   . Hypertension Maternal Grandfather    Allergies  Allergen Reactions  . Percocet [Oxycodone-Acetaminophen] Other (See Comments)    Dizziness,  Confusion, rapid heartbeat    ROS Const: Denies fevers   OBJECTIVE: No conversational dyspnea Age appropriate judgment and insight Nml affect and mood  ASSESSMENT/PLAN: Irritable bowel syndrome with both constipation and diarrhea - Plan: prn Bentyl  for pain. Offered TCA, declined at this time. Requesting exemptions for longer breaks at work. 2x/week up to 20 min; up to 60 min 1-2 times per month. Indefinite.   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 The patient voiced understanding and agreement to the plan.   [place], DO 05/28/19  10:19 AM

## 2019-06-03 ENCOUNTER — Telehealth: Payer: Self-pay | Admitting: Family Medicine

## 2019-06-03 NOTE — Telephone Encounter (Signed)
Copied from [place] [id number]. Topic: General - Other >> Jun 03, 2019  4:28 PM Rainey Pines A wrote: Patient would like a callback In regards to her FMLA paperwork that was faxed over and would like an update  Have you seen any paper work?

## 2019-06-04 DIAGNOSIS — Z0279 Encounter for issue of other medical certificate: Secondary | ICD-10-CM

## 2019-06-04 NOTE — Telephone Encounter (Signed)
Have not seen anything yet.

## 2019-06-04 NOTE — Telephone Encounter (Signed)
Form completed/faxed to [phone]//copied given copy/charge sheet to Jennifer//fax confirmation. Called the patient informed faxed form

## 2019-11-14 DIAGNOSIS — Z0279 Encounter for issue of other medical certificate: Secondary | ICD-10-CM

## 2019-11-21 ENCOUNTER — Telehealth: Payer: Self-pay | Admitting: Family Medicine

## 2019-11-21 NOTE — Telephone Encounter (Signed)
Completed/faxed FMLA//copy and charge sheet given to Jennifer/original sent to scan

## 2020-01-14 ENCOUNTER — Other Ambulatory Visit: Payer: Self-pay

## 2020-01-14 ENCOUNTER — Ambulatory Visit (INDEPENDENT_AMBULATORY_CARE_PROVIDER_SITE_OTHER): Payer: 59 | Admitting: Family Medicine

## 2020-01-14 ENCOUNTER — Encounter: Payer: Self-pay | Admitting: Family Medicine

## 2020-01-14 VITALS — Temp 97.9°F

## 2020-01-14 DIAGNOSIS — K582 Mixed irritable bowel syndrome: Secondary | ICD-10-CM

## 2020-01-14 DIAGNOSIS — D649 Anemia, unspecified: Secondary | ICD-10-CM

## 2020-01-14 MED ORDER — AMITRIPTYLINE HCL 25 MG PO TABS: 25.0000 mg | ORAL_TABLET | Freq: Every day | ORAL | 2 refills | Status: DC

## 2020-01-14 NOTE — Progress Notes (Signed)
Chief Complaint  Patient presents with  . Irritable Bowel Syndrome    Subjective: Patient is a 39 y.o. female here for f/u IBS. Due to COVID-19 pandemic, we are interacting via web portal for an electronic face-to-face visit. I verified patient's ID using 2 identifiers. Patient agreed to proceed with visit via this method. Patient is at home, I am at office. Patient and I are present for visit.   IBS- D/C Abd pain w alternating d/c worsening since Dec. Having some nausea. This is affecting her work. Work cancelled [employer]. Eating is difficult. Declined TCA in past. No weight changes. Appetite fluctuates. Denies fevers or bleeding.   ROS: Const: No fevers GI: As noted in HPI  Past Medical History:  Diagnosis Date  . Anemia   . Chlamydia 2009  . Fibroid   . Gestational diabetes    first preg  . IBS (irritable bowel syndrome)     Objective: Temp 97.9 F (36.6 C) (Temporal)  No conversational dyspnea Age appropriate judgment and insight Nml affect and mood  Assessment and Plan: Irritable bowel syndrome with both constipation and diarrhea - Plan: amitriptyline (ELAVIL) 25 MG tablet  Chronic anemia - Plan: CBC  Will fill out forms at her request. We need to start TCA given her s/s's. If no improvement, will consider a different neuromodulator vs FD/IBGard.  F/u in 4 weeks.  The patient voiced understanding and agreement to the plan.  [place], DO 01/15/20  11:08 AM

## 2020-01-15 ENCOUNTER — Other Ambulatory Visit: Payer: Self-pay

## 2020-01-16 ENCOUNTER — Other Ambulatory Visit (INDEPENDENT_AMBULATORY_CARE_PROVIDER_SITE_OTHER): Payer: 59

## 2020-01-16 ENCOUNTER — Other Ambulatory Visit: Payer: Self-pay

## 2020-01-16 DIAGNOSIS — D649 Anemia, unspecified: Secondary | ICD-10-CM | POA: Diagnosis not present

## 2020-01-16 LAB — CBC
HCT: 35.5 % — ABNORMAL LOW (ref 36.0–46.0)
Hemoglobin: 11.8 g/dL — ABNORMAL LOW (ref 12.0–15.0)
MCHC: 33.3 g/dL (ref 30.0–36.0)
MCV: 80.1 fl (ref 78.0–100.0)
Platelets: 345 10*3/uL (ref 150.0–400.0)
RBC: 4.43 Mil/uL (ref 3.87–5.11)
RDW: 14 % (ref 11.5–15.5)
WBC: 6.2 10*3/uL (ref 4.0–10.5)

## 2020-01-22 ENCOUNTER — Telehealth: Payer: Self-pay

## 2020-01-22 NOTE — Telephone Encounter (Signed)
Who Is Calling Patient / Member / Family / Caregiver Caller Name [name] Phone Number [phone] Patient Name [name] [name] Patient DOB 09/17/1981 Call Type Message Only Information Provided Reason for Call Request for General Office Information Initial Comment: Caller states she is calling to see if doctor got paper work from her employer.

## 2020-01-23 NOTE — Telephone Encounter (Signed)
Called the patient to inform paperwork received///PCP completed and faxed/received fax confirmation

## 2020-01-23 NOTE — Telephone Encounter (Signed)
Sent original to scan

## 2020-01-23 NOTE — Telephone Encounter (Signed)
Paperwork received PCP has completed Faxed today 01/23/2020///Received fax confirmation

## 2020-02-03 ENCOUNTER — Telehealth: Payer: Self-pay | Admitting: Family Medicine

## 2020-02-03 NOTE — Telephone Encounter (Signed)
I am OK with her not taking the med and she doesn't need to f/u with me. I think she should consider seeing GI again if she is still needing a lot of time away form work. TY.

## 2020-02-03 NOTE — Telephone Encounter (Signed)
S/W Patient she stated that although she is still in Pain she decided not to take the amitriptyline (ELAVIL) 25 MG tablet. She states she decided to focus more on her health and her diet. With daily walks and eating better. She researched meds and decided that she didn't want take it.  She also canceled her appt. And stated that if you think she still needs to come in. Please call and let her know.

## 2020-02-04 NOTE — Telephone Encounter (Signed)
Called left message to call back 

## 2020-02-04 NOTE — Telephone Encounter (Signed)
Explained to the patient PCP response if she is not going to do treatment but still requesting to be out of work would need to see GI and have them do paperwork/discuss symptoms. She stated needed paperwork to excuse through today and work will fax over that today.

## 2020-02-11 ENCOUNTER — Ambulatory Visit: Payer: Self-pay | Admitting: Family Medicine

## 2020-02-12 ENCOUNTER — Other Ambulatory Visit: Payer: Self-pay

## 2020-02-13 ENCOUNTER — Ambulatory Visit (INDEPENDENT_AMBULATORY_CARE_PROVIDER_SITE_OTHER): Payer: 59 | Admitting: Family Medicine

## 2020-02-13 ENCOUNTER — Other Ambulatory Visit: Payer: Self-pay

## 2020-02-13 ENCOUNTER — Encounter: Payer: Self-pay | Admitting: Family Medicine

## 2020-02-13 VITALS — BP 115/71 | HR 89 | Temp 98.7°F | Resp 16 | Ht 65.0 in | Wt 188.0 lb

## 2020-02-13 DIAGNOSIS — R109 Unspecified abdominal pain: Secondary | ICD-10-CM | POA: Diagnosis not present

## 2020-02-13 DIAGNOSIS — Z832 Family history of diseases of the blood and blood-forming organs and certain disorders involving the immune mechanism: Secondary | ICD-10-CM

## 2020-02-13 DIAGNOSIS — K582 Mixed irritable bowel syndrome: Secondary | ICD-10-CM | POA: Diagnosis not present

## 2020-02-13 DIAGNOSIS — G8929 Other chronic pain: Secondary | ICD-10-CM | POA: Diagnosis not present

## 2020-02-13 NOTE — Progress Notes (Signed)
Chief Complaint  Patient presents with  . Abdominal Pain    Complains of abdominal pain on and off since August 2019, getting worst  . Chest Pain    "at times and it feels like gas"    Subjective: Patient is a 39 y.o. female here for eval of spherocytosis.  +famhx of hereditary spherocytosis in dad, sister. She had testing >10 yrs ago that was neg. Has vague abd s/s's that have been worked up as IBS by GI team. She continues to have constant dull pain suprapubic. She will have sharper pain on 2 spots on L side of abd. Her sister had similar vague abd complaints prior to being dx'd w hereditary spherocytosis. She has fatigue. No areas of easy bruising/bleeding. Did have some anemia around 1 mo ago.  Interested in seeing GI again. No N/V, unintentional wt loss, bleeding.   Past Medical History:  Diagnosis Date  . Anemia   . Chlamydia 2009  . Fibroid   . Gestational diabetes    first preg  . IBS (irritable bowel syndrome)     Objective: BP 115/71 (BP Location: Right Arm, Patient Position: Sitting, Cuff Size: Small)   Pulse 89   Temp 98.7 F (37.1 C) (Temporal)   Resp 16   Ht 5\' 5"  (1.651 m)   Wt 188 lb (85.3 kg)   SpO2 100%   BMI 31.28 kg/m  General: Awake, appears stated age HEENT: MMM, EOMi, no scleral icterus, no jaundice under tongue.  Heart: RRR Skin: No icterus Abd: BS+, S, mild L sided ttp, fullness to palpation in suprapubic region, neg Rovsing's, McBurney's, Murphy's, Carnett's Lungs: CTAB, no rales, wheezes or rhonchi. No accessory muscle use Psych: Age appropriate judgment and insight, normal affect and mood  Assessment and Plan: Family history of hereditary spherocytosis - Plan: CBC (INCLUDES DIFF/PLT) WITH PATHOLOGIST REVIEW, Hepatic function panel, Lactate Dehydrogenase (LDH), Haptoglobin, IBC + Ferritin  Irritable bowel syndrome with both constipation and diarrhea - Plan: Ambulatory referral to Gastroenterology  Chronic abdominal pain - Plan: Ambulatory  referral to Gastroenterology  IB-GARD rec'd. She declined tx w TCA. Will refer back to GI. Ck above labs. If neg, will defer to gi. If positive, will refer to heme for further eval.  F/u as originally scheduled.  The patient voiced understanding and agreement to the plan.  [place], DO 02/14/20  9:49 AM

## 2020-02-13 NOTE — Patient Instructions (Signed)
Once you get your labs done, give me a week to get your results back to you.  Consider IB-Gard which is over the counter. This can sometimes help symptoms.  Let us know if you need anything.

## 2020-02-16 ENCOUNTER — Telehealth: Payer: Self-pay | Admitting: Family Medicine

## 2020-02-16 NOTE — Telephone Encounter (Signed)
SHORT TERM DISABILITY    Caller : [name] [name]  Call Back [phone] [facility] Fax  # [phone] [place] # [phone] Claim # [id number]  Concern: Per patient [employer] [place] hasn't receive information from provider as of 02/16/2020.Per patient form was for Short Term Disability.  Per patient [employer] [place] would like  Last 2 offices and the GI referral and any other supporting documentation  ASAP

## 2020-02-17 NOTE — Telephone Encounter (Signed)
Advise as thought this was to be done by GI

## 2020-02-17 NOTE — Telephone Encounter (Signed)
I've already filled out the forms. I thought we sent last Friday?

## 2020-02-17 NOTE — Telephone Encounter (Signed)
Faxed office notes/GI referral/copy of [place] form. Called the patient informed all requested information faxed

## 2020-02-17 NOTE — Telephone Encounter (Signed)
Pt states that she needs it resent. And to add the claim number Claim # [id number]  She forgot to give that info when she was here. Please resend with claim # if possible Please Thanks

## 2020-02-19 ENCOUNTER — Other Ambulatory Visit (INDEPENDENT_AMBULATORY_CARE_PROVIDER_SITE_OTHER): Payer: 59

## 2020-02-19 ENCOUNTER — Other Ambulatory Visit: Payer: Self-pay

## 2020-02-19 ENCOUNTER — Telehealth: Payer: Self-pay | Admitting: Family Medicine

## 2020-02-19 DIAGNOSIS — Z832 Family history of diseases of the blood and blood-forming organs and certain disorders involving the immune mechanism: Secondary | ICD-10-CM

## 2020-02-19 NOTE — Telephone Encounter (Signed)
Patient states [facility] wasn't going to work for her and she now wants to go to [facility]. Please have referral sent over.

## 2020-02-20 ENCOUNTER — Other Ambulatory Visit: Payer: Self-pay | Admitting: Family Medicine

## 2020-02-20 DIAGNOSIS — K582 Mixed irritable bowel syndrome: Secondary | ICD-10-CM

## 2020-02-20 LAB — IBC + FERRITIN
Ferritin: 15.4 ng/mL (ref 10.0–291.0)
Iron: 28 ug/dL — ABNORMAL LOW (ref 42–145)
Saturation Ratios: 7.1 % — ABNORMAL LOW (ref 20.0–50.0)
Transferrin: 283 mg/dL (ref 212.0–360.0)

## 2020-02-20 LAB — CBC (INCLUDES DIFF/PLT) WITH PATHOLOGIST REVIEW
Absolute Monocytes: 435 cells/uL (ref 200–950)
Basophils Absolute: 20 cells/uL (ref 0–200)
Basophils Relative: 0.3 %
Eosinophils Absolute: 122 cells/uL (ref 15–500)
Eosinophils Relative: 1.8 %
HCT: 33.8 % — ABNORMAL LOW (ref 35.0–45.0)
Hemoglobin: 11 g/dL — ABNORMAL LOW (ref 11.7–15.5)
Lymphs Abs: 2217 cells/uL (ref 850–3900)
MCH: 26.4 pg — ABNORMAL LOW (ref 27.0–33.0)
MCHC: 32.5 g/dL (ref 32.0–36.0)
MCV: 81.3 fL (ref 80.0–100.0)
MPV: 10.3 fL (ref 7.5–12.5)
Monocytes Relative: 6.4 %
Neutro Abs: 4005 cells/uL (ref 1500–7800)
Neutrophils Relative %: 58.9 %
Platelets: 325 10*3/uL (ref 140–400)
RBC: 4.16 10*6/uL (ref 3.80–5.10)
RDW: 13.5 % (ref 11.0–15.0)
Total Lymphocyte: 32.6 %
WBC: 6.8 10*3/uL (ref 3.8–10.8)

## 2020-02-20 LAB — HEPATIC FUNCTION PANEL
ALT: 12 U/L (ref 0–35)
AST: 16 U/L (ref 0–37)
Albumin: 4.2 g/dL (ref 3.5–5.2)
Alkaline Phosphatase: 38 U/L — ABNORMAL LOW (ref 39–117)
Bilirubin, Direct: 0.1 mg/dL (ref 0.0–0.3)
Total Bilirubin: 0.5 mg/dL (ref 0.2–1.2)
Total Protein: 6.9 g/dL (ref 6.0–8.3)

## 2020-02-20 LAB — LACTATE DEHYDROGENASE: LDH: 128 U/L (ref 100–200)

## 2020-02-20 LAB — HAPTOGLOBIN: Haptoglobin: 126 mg/dL (ref 43–212)

## 2020-02-20 NOTE — Telephone Encounter (Signed)
Referral done

## 2020-02-20 NOTE — Telephone Encounter (Signed)
Ok

## 2022-05-10 ENCOUNTER — Encounter: Payer: Self-pay | Admitting: Family Medicine

## 2022-05-10 ENCOUNTER — Ambulatory Visit (INDEPENDENT_AMBULATORY_CARE_PROVIDER_SITE_OTHER): Payer: Medicaid Other | Admitting: Family Medicine

## 2022-05-10 VITALS — BP 110/70 | HR 71 | Temp 98.3°F | Ht 65.0 in | Wt 201.2 lb

## 2022-05-10 DIAGNOSIS — Z111 Encounter for screening for respiratory tuberculosis: Secondary | ICD-10-CM

## 2022-05-10 DIAGNOSIS — Z1159 Encounter for screening for other viral diseases: Secondary | ICD-10-CM | POA: Diagnosis not present

## 2022-05-10 DIAGNOSIS — Z0000 Encounter for general adult medical examination without abnormal findings: Secondary | ICD-10-CM

## 2022-05-10 NOTE — Patient Instructions (Addendum)
Give [place] 2-3 business days to get the results of your labs back. It will take around 1 week to get the results of your Tb test back.   Keep the diet clean and stay active.  Please get me a copy of your advanced directive form at your convenience.   Consider IBGard (concentrated peppermint) for IBS symptoms.   Let us know if you need anything.  Knee Exercises It is normal to feel mild stretching, pulling, tightness, or discomfort as you do these exercises, but you should stop right away if you feel sudden pain or your pain gets worse.  STRETCHING AND RANGE OF MOTION EXERCISES  These exercises warm up your muscles and joints and improve the movement and flexibility of your knee. These exercises also help to relieve pain, numbness, and tingling. Exercise A: Knee Extension, Prone  Lie on your abdomen on a bed. Place your left / right knee just beyond the edge of the surface so your knee is not on the bed. You can put a towel under your left / right thigh just above your knee for comfort. Relax your leg muscles and allow gravity to straighten your knee. You should feel a stretch behind your left / right knee. Hold this position for 30 seconds. Scoot up so your knee is supported between repetitions. Repeat 2 times. Complete this stretch 3 times per week. Exercise B: Knee Flexion, Active     Lie on your back with both knees straight. If this causes back discomfort, bend your left / right knee so your foot is flat on the floor. Slowly slide your left / right heel back toward your buttocks until you feel a gentle stretch in the front of your knee or thigh. Hold this position for 30 seconds. Slowly slide your left / right heel back to the starting position. Repeat 2 times. Complete this exercise 3 times per week. Exercise C: Quadriceps, Prone     Lie on your abdomen on a firm surface, such as a bed or padded floor. Bend your left / right knee and hold your ankle. If you cannot reach your ankle  or pant leg, loop a belt around your foot and grab the belt instead. Gently pull your heel toward your buttocks. Your knee should not slide out to the side. You should feel a stretch in the front of your thigh and knee. Hold this position for 30 seconds. Repeat 2 times. Complete this stretch 3 times per week. Exercise D: Hamstring, Supine  Lie on your back. Loop a belt or towel over the ball of your left / right foot. The ball of your foot is on the walking surface, right under your toes. Straighten your left / right knee and slowly pull on the belt to raise your leg until you feel a gentle stretch behind your knee. Do not let your left / right knee bend while you do this. Keep your other leg flat on the floor. Hold this position for 30 seconds. Repeat 2 times. Complete this stretch 3 times per week. STRENGTHENING EXERCISES  These exercises build strength and endurance in your knee. Endurance is the ability to use your muscles for a long time, even after they get tired. Exercise E: Quadriceps, Isometric     Lie on your back with your left / right leg extended and your other knee bent. Put a rolled towel or small pillow under your knee if told by your health care provider. Slowly tense the muscles in the  front of your left / right thigh. You should see your kneecap slide up toward your hip or see increased dimpling just above the knee. This motion will push the back of the knee toward the floor. For 3 seconds, keep the muscle as tight as you can without increasing your pain. Relax the muscles slowly and completely. Repeat for 10 total reps Repeat 2 ti mes. Complete this exercise 3 times per week. Exercise F: Straight Leg Raises - Quadriceps  Lie on your back with your left / right leg extended and your other knee bent. Tense the muscles in the front of your left / right thigh. You should see your kneecap slide up or see increased dimpling just above the knee. Your thigh may even shake a  bit. Keep these muscles tight as you raise your leg 4-6 inches (10-15 cm) off the floor. Do not let your knee bend. Hold this position for 3 seconds. Keep these muscles tense as you lower your leg. Relax your muscles slowly and completely after each repetition. 10 total reps. Repeat 2 times. Complete this exercise 3 times per week.  Exercise G: Hamstring Curls     If told by your health care provider, do this exercise while wearing ankle weights. Begin with 5 lb weights (optional). Then increase the weight by 1 lb (0.5 kg) increments. Do not wear ankle weights that are more than 20 lbs to start with. Lie on your abdomen with your legs straight. Bend your left / right knee as far as you can without feeling pain. Keep your hips flat against the floor. Hold this position for 3 seconds. Slowly lower your leg to the starting position. Repeat for 10 reps.  Repeat 2 times. Complete this exercise 3 times per week. Exercise H: Squats (Quadriceps)  Stand in front of a table, with your feet and knees pointing straight ahead. You may rest your hands on the table for balance but not for support. Slowly bend your knees and lower your hips like you are going to sit in a chair. Keep your weight over your heels, not over your toes. Keep your lower legs upright so they are parallel with the table legs. Do not let your hips go lower than your knees. Do not bend lower than told by your health care provider. If your knee pain increases, do not bend as low. Hold the squat position for 1 second. Slowly push with your legs to return to standing. Do not use your hands to pull yourself to standing. Repeat 2 times. Complete this exercise 3 times per week. Exercise I: Wall Slides (Quadriceps)     Lean your back against a smooth wall or door while you walk your feet out 18-24 inches (46-61 cm) from it. Place your feet hip-width apart. Slowly slide down the wall or door until your knees Repeat 2 times. Complete  this exercise every other day. Exercise K: Straight Leg Raises - Hip Abductors  Lie on your side with your left / right leg in the top position. Lie so your head, shoulder, knee, and hip line up. You may bend your bottom knee to help you keep your balance. Roll your hips slightly forward so your hips are stacked directly over each other and your left / right knee is facing forward. Leading with your heel, lift your top leg 4-6 inches (10-15 cm). You should feel the muscles in your outer hip lifting. Do not let your foot drift forward. Do not let your knee  roll toward the ceiling. Hold this position for 3 seconds. Slowly return your leg to the starting position. Let your muscles relax completely after each repetition. 10 total reps. Repeat 2 times. Complete this exercise 3 times per week. Exercise J: Straight Leg Raises - Hip Extensors  Lie on your abdomen on a firm surface. You can put a pillow under your hips if that is more comfortable. Tense the muscles in your buttocks and lift your left / right leg about 4-6 inches (10-15 cm). Keep your knee straight as you lift your leg. Hold this position for 3 seconds. Slowly lower your leg to the starting position. Let your leg relax completely after each repetition. Repeat 2 times. Complete this exercise 3 times per week. Document Released: 09/06/2005 Document Revised: 07/17/2016 Document Reviewed: 08/29/2015 Elsevier Interactive Patient Education  2017 [employer].

## 2022-05-10 NOTE — Progress Notes (Signed)
Chief Complaint  Patient presents with   Annual Exam    Blood test for TB for work     Well Woman [name] [name] is here for a complete physical.   Her last physical was >1 year ago.  Current diet: in general, a "healthy" diet. Current exercise: some walking, fitness videos. Weight is steadily decreasing and she denies fatigue out of ordinary. Seatbelt? Yes Advanced directive? No  Health Maintenance Pap/HPV- Yes Tetanus- Due Hep C screening- No HIV screening- Yes  Past Medical History:  Diagnosis Date   Anemia    Chlamydia 2009   Fibroid    Gestational diabetes    first preg   IBS (irritable bowel syndrome)      Past Surgical History:  Procedure Laterality Date   APPENDECTOMY  1995    Medications  Current Outpatient Medications on File Prior to Visit  Medication Sig Dispense Refill   cholecalciferol (VITAMIN D) 1000 UNITS tablet Take 4,000 Units by mouth daily.     OVER THE COUNTER MEDICATION Take 1 scoop by mouth daily. Powdered multivitamin (adds to food)     Allergies Allergies  Allergen Reactions   Percocet [Oxycodone-Acetaminophen] Other (See Comments)    Dizziness,  Confusion, rapid heartbeat    Review of Systems: Constitutional:  no unexpected weight changes Eye:  no recent significant change in vision Ear/Nose/Mouth/Throat:  Ears:  no recent change in hearing Nose/Mouth/Throat:  no complaints of nasal congestion, no sore throat Cardiovascular: no chest pain Respiratory:  no shortness of breath Gastrointestinal:  no new abdominal pain, no recent change in bowel habits GU:  Female: negative for dysuria or pelvic pain Musculoskeletal/Extremities:  +R knee stiffness intermittently Integumentary (Skin/Breast):  no abnormal skin lesions reported Neurologic:  no headaches Endocrine:  denies fatigue Hematologic/Lymphatic:  No areas of easy bleeding  Exam BP 110/70   Pulse 71   Temp 98.3 F (36.8 C) (Oral)   Ht '5\' 5"'$  (1.651 m)   Wt 201 lb 4 oz  (91.3 kg)   SpO2 99%   BMI 33.49 kg/m  General:  well developed, well nourished, in no apparent distress Skin:  no significant moles, warts, or growths Head:  no masses, lesions, or tenderness Eyes:  pupils equal and round, sclera anicteric without injection Ears:  canals without lesions, TMs shiny without retraction, no obvious effusion, no erythema Nose:  nares patent, septum midline, mucosa normal, and no drainage or sinus tenderness Throat/Pharynx:  lips and gingiva without lesion; tongue and uvula midline; non-inflamed pharynx; no exudates or postnasal drainage Neck: neck supple without adenopathy, thyromegaly, or masses Lungs:  clear to auscultation, breath sounds equal bilaterally, no respiratory distress Cardio:  regular rate and rhythm, no LE edema Abdomen:  abdomen soft, nontender; bowel sounds normal; no masses or organomegaly Genital: Defer to GYN Musculoskeletal:  symmetrical muscle groups noted without atrophy or deformity Extremities:  no clubbing, cyanosis, or edema, no deformities, no skin discoloration Neuro:  gait normal; deep tendon reflexes normal and symmetric Psych: well oriented with normal range of affect and appropriate judgment/insight  Assessment and Plan  Well adult exam - Plan: CBC, Comprehensive metabolic panel, Lipid panel  Screening-pulmonary TB - Plan: QuantiFERON-TB Gold Plus  Encounter for hepatitis C screening test for low risk patient - Plan: Hepatitis C antibody   Well 41 y.o. female. Counseled on diet and exercise. Advanced directive form provided today.  Tb testing for work.  Tdap politely declined. She wants to research specific ingredients first.  Hep C screen  today.  Other orders as above. Follow up in 1 yr or prn. The patient voiced understanding and agreement to the plan.  [place], DO 05/10/22 1:38 PM

## 2022-05-11 ENCOUNTER — Encounter: Payer: Self-pay | Admitting: Family Medicine

## 2022-05-11 LAB — COMPREHENSIVE METABOLIC PANEL
ALT: 14 U/L (ref 0–35)
AST: 15 U/L (ref 0–37)
Albumin: 4.5 g/dL (ref 3.5–5.2)
Alkaline Phosphatase: 51 U/L (ref 39–117)
BUN: 9 mg/dL (ref 6–23)
CO2: 27 mEq/L (ref 19–32)
Calcium: 9.3 mg/dL (ref 8.4–10.5)
Chloride: 105 mEq/L (ref 96–112)
Creatinine, Ser: 0.97 mg/dL (ref 0.40–1.20)
GFR: 73.23 mL/min (ref >60.00)
Glucose, Bld: 82 mg/dL (ref 70–99)
Potassium: 3.7 mEq/L (ref 3.5–5.1)
Sodium: 139 mEq/L (ref 135–145)
Total Bilirubin: 0.3 mg/dL (ref 0.2–1.2)
Total Protein: 6.9 g/dL (ref 6.0–8.3)

## 2022-05-11 LAB — CBC
HCT: 38.6 % (ref 36.0–46.0)
Hemoglobin: 12.4 g/dL (ref 12.0–15.0)
MCHC: 32.2 g/dL (ref 30.0–36.0)
MCV: 81.3 fl (ref 78.0–100.0)
Platelets: 342 10*3/uL (ref 150.0–400.0)
RBC: 4.75 Mil/uL (ref 3.87–5.11)
RDW: 14.7 % (ref 11.5–15.5)
WBC: 7.4 10*3/uL (ref 4.0–10.5)

## 2022-05-11 LAB — LIPID PANEL
Cholesterol: 196 mg/dL (ref 0–200)
HDL: 51.9 mg/dL (ref >39.00)
LDL Cholesterol: 133 mg/dL — ABNORMAL HIGH (ref 0–99)
NonHDL: 143.8
Total CHOL/HDL Ratio: 4
Triglycerides: 54 mg/dL (ref 0.0–149.0)
VLDL: 10.8 mg/dL (ref 0.0–40.0)

## 2022-05-11 LAB — HEPATITIS C ANTIBODY: Hepatitis C Ab: NONREACTIVE

## 2022-05-12 LAB — QUANTIFERON-TB GOLD PLUS
Mitogen-NIL: >10 IU/mL
NIL: 0.04 IU/mL
QuantiFERON-TB Gold Plus: NEGATIVE
TB1-NIL: 0 IU/mL
TB2-NIL: 0 IU/mL

## 2022-06-06 ENCOUNTER — Encounter (HOSPITAL_BASED_OUTPATIENT_CLINIC_OR_DEPARTMENT_OTHER): Payer: Self-pay | Admitting: Emergency Medicine

## 2022-06-06 ENCOUNTER — Emergency Department (HOSPITAL_BASED_OUTPATIENT_CLINIC_OR_DEPARTMENT_OTHER)
Admission: EM | Admit: 2022-06-06 | Discharge: 2022-06-07 | Disposition: A | Discharge status: Home / Self Care | Payer: Medicaid Other | Attending: Emergency Medicine | Admitting: Emergency Medicine

## 2022-06-06 ENCOUNTER — Emergency Department (HOSPITAL_BASED_OUTPATIENT_CLINIC_OR_DEPARTMENT_OTHER): Payer: Medicaid Other

## 2022-06-06 ENCOUNTER — Other Ambulatory Visit: Payer: Self-pay

## 2022-06-06 DIAGNOSIS — R7989 Other specified abnormal findings of blood chemistry: Secondary | ICD-10-CM | POA: Insufficient documentation

## 2022-06-06 DIAGNOSIS — R1084 Generalized abdominal pain: Secondary | ICD-10-CM | POA: Diagnosis not present

## 2022-06-06 DIAGNOSIS — R109 Unspecified abdominal pain: Secondary | ICD-10-CM

## 2022-06-06 LAB — CBC
HCT: 34.7 % — ABNORMAL LOW (ref 36.0–46.0)
Hemoglobin: 11.4 g/dL — ABNORMAL LOW (ref 12.0–15.0)
MCH: 26.5 pg (ref 26.0–34.0)
MCHC: 32.9 g/dL (ref 30.0–36.0)
MCV: 80.7 fL (ref 80.0–100.0)
Platelets: 299 10*3/uL (ref 150–400)
RBC: 4.3 MIL/uL (ref 3.87–5.11)
RDW: 13.3 % (ref 11.5–15.5)
WBC: 9.3 10*3/uL (ref 4.0–10.5)
nRBC: 0 % (ref 0.0–0.2)

## 2022-06-06 LAB — COMPREHENSIVE METABOLIC PANEL
ALT: 157 U/L — ABNORMAL HIGH (ref 0–44)
AST: 393 U/L — ABNORMAL HIGH (ref 15–41)
Albumin: 3.9 g/dL (ref 3.5–5.0)
Alkaline Phosphatase: 58 U/L (ref 38–126)
Anion gap: 5 (ref 5–15)
BUN: 9 mg/dL (ref 6–20)
CO2: 24 mmol/L (ref 22–32)
Calcium: 8.7 mg/dL — ABNORMAL LOW (ref 8.9–10.3)
Chloride: 107 mmol/L (ref 98–111)
Creatinine, Ser: 0.95 mg/dL (ref 0.44–1.00)
GFR, Estimated: >60 mL/min (ref >60)
Glucose, Bld: 124 mg/dL — ABNORMAL HIGH (ref 70–99)
Potassium: 3.6 mmol/L (ref 3.5–5.1)
Sodium: 136 mmol/L (ref 135–145)
Total Bilirubin: 1 mg/dL (ref 0.3–1.2)
Total Protein: 7.2 g/dL (ref 6.5–8.1)

## 2022-06-06 LAB — PREGNANCY, URINE: Preg Test, Ur: NEGATIVE

## 2022-06-06 LAB — URINALYSIS, ROUTINE W REFLEX MICROSCOPIC
Bilirubin Urine: NEGATIVE
Glucose, UA: NEGATIVE mg/dL
Hgb urine dipstick: NEGATIVE
Ketones, ur: NEGATIVE mg/dL
Leukocytes,Ua: NEGATIVE
Nitrite: NEGATIVE
Protein, ur: NEGATIVE mg/dL
Specific Gravity, Urine: 1.02 (ref 1.005–1.030)
pH: 7.5 (ref 5.0–8.0)

## 2022-06-06 NOTE — ED Triage Notes (Signed)
Patient c/o lower abdominal pain since around 530 pm with nausea.

## 2022-06-07 ENCOUNTER — Encounter (HOSPITAL_BASED_OUTPATIENT_CLINIC_OR_DEPARTMENT_OTHER): Payer: Self-pay | Admitting: Emergency Medicine

## 2022-06-07 MED ORDER — ONDANSETRON HCL 4 MG/2ML IJ SOLN
4.0000 mg | Freq: Once | INTRAMUSCULAR | Status: AC
  Administered 2022-06-07: 4 mg via INTRAVENOUS
  Filled 2022-06-07: qty 2

## 2022-06-07 MED ORDER — DICYCLOMINE HCL 20 MG PO TABS: 20.0000 mg | ORAL_TABLET | Freq: Two times a day (BID) | ORAL | 0 refills | Status: DC

## 2022-06-07 MED ORDER — ONDANSETRON 8 MG PO TBDP: ORAL_TABLET | ORAL | 0 refills | Status: DC

## 2022-06-07 MED ORDER — KETOROLAC TROMETHAMINE 60 MG/2ML IM SOLN: 60.0000 mg | Freq: Once | INTRAMUSCULAR | Status: DC

## 2022-06-07 MED ORDER — DICYCLOMINE HCL 10 MG/ML IM SOLN
20.0000 mg | Freq: Once | INTRAMUSCULAR | Status: DC
  Filled 2022-06-07: qty 2

## 2022-06-07 MED ORDER — KETOROLAC TROMETHAMINE 30 MG/ML IJ SOLN
30.0000 mg | Freq: Once | INTRAMUSCULAR | Status: AC
  Administered 2022-06-07: 30 mg via INTRAVENOUS
  Filled 2022-06-07: qty 1

## 2022-06-07 MED ORDER — ONDANSETRON 4 MG PO TBDP: 8.0000 mg | ORAL_TABLET | Freq: Once | ORAL | Status: DC

## 2022-06-07 NOTE — ED Provider Notes (Signed)
[facility] EMERGENCY DEPARTMENT Provider Note   CSN: [id number] Arrival date & time: 06/06/22  2224     History  Chief Complaint  Patient presents with   Abdominal Pain    [name] [name] is a 41 y.o. female.  The history is provided by the patient.  Abdominal Pain Pain location:  Generalized Pain radiates to:  Does not radiate Pain severity:  Moderate Onset quality:  Gradual Duration:  6 hours Timing:  Constant Progression:  Partially resolved Chronicity:  New Context: not sick contacts, not suspicious food intake and not trauma   Relieved by:  Nothing Worsened by:  Nothing Associated symptoms: no constipation, no diarrhea, no dysuria and no fever   Risk factors: no NSAID use   Patient with IBS presents with abdominal pain that started at ~530 pm.  No diarrhea.  No constipation.  No trauma.  Stated that while she has been here pain is nearly gone.  No urinary or vaginal complaints.       Home Medications Prior to Admission medications   Medication Sig Start Date End Date Taking? Authorizing Provider  dicyclomine (BENTYL) 20 MG tablet Take 1 tablet (20 mg total) by mouth 2 (two) times daily. 06/07/22  Yes Chadric Kimberley, MD  ondansetron (ZOFRAN-ODT) 8 MG disintegrating tablet '8mg'$  ODT q8 hours prn nausea 06/07/22  Yes Payal Stanforth, MD  cholecalciferol (VITAMIN D) 1000 UNITS tablet Take 4,000 Units by mouth daily.    [provider]  OVER THE COUNTER MEDICATION Take 1 scoop by mouth daily. Powdered multivitamin (adds to food)    [provider]      Allergies    Percocet [oxycodone-acetaminophen]    Review of Systems   Review of Systems  Constitutional:  Negative for fever.  HENT:  Negative for facial swelling.   Eyes:  Negative for redness.  Gastrointestinal:  Positive for abdominal pain. Negative for constipation and diarrhea.  Genitourinary:  Negative for dysuria.  All other systems reviewed and are negative.   Physical  Exam Updated Vital Signs BP 130/88   Pulse 63   Temp 98.9 F (37.2 C) (Oral)   Resp 18   Ht '5\' 5"'$  (1.651 m)   Wt 86.2 kg   SpO2 100%   BMI 31.62 kg/m  Physical Exam Vitals and nursing note reviewed.  Constitutional:      General: She is not in acute distress.    Appearance: Normal appearance. She is well-developed.  HENT:     Head: Normocephalic and atraumatic.     Nose: Nose normal.  Eyes:     Pupils: Pupils are equal, round, and reactive to light.  Cardiovascular:     Rate and Rhythm: Normal rate and regular rhythm.     Pulses: Normal pulses.     Heart sounds: Normal heart sounds.  Pulmonary:     Effort: Pulmonary effort is normal. No respiratory distress.     Breath sounds: Normal breath sounds.  Abdominal:     General: Bowel sounds are normal. There is no distension.     Palpations: Abdomen is soft.     Tenderness: There is no abdominal tenderness. There is no guarding or rebound. Negative signs include Murphy's sign and McBurney's sign.  Genitourinary:    Vagina: No vaginal discharge.  Musculoskeletal:        General: Normal range of motion.     Cervical back: Neck supple.  Skin:    General: Skin is warm and dry.  Capillary Refill: Capillary refill takes less than 2 seconds.     Findings: No erythema or rash.  Neurological:     General: No focal deficit present.     Mental Status: She is alert.     Deep Tendon Reflexes: Reflexes normal.  Psychiatric:        Mood and Affect: Mood normal.        Behavior: Behavior normal.     ED Results / Procedures / Treatments   Labs (all labs ordered are listed, but only abnormal results are displayed) Labs Reviewed  COMPREHENSIVE METABOLIC PANEL - Abnormal; Notable for the following components:      Result Value   Glucose, Bld 124 (*)    Calcium 8.7 (*)    AST 393 (*)    ALT 157 (*)    All other components within normal limits  CBC - Abnormal; Notable for the following components:   Hemoglobin 11.4 (*)    HCT  34.7 (*)    All other components within normal limits  URINALYSIS, ROUTINE W REFLEX MICROSCOPIC - Abnormal; Notable for the following components:   APPearance CLOUDY (*)    All other components within normal limits  PREGNANCY, URINE    EKG None  Radiology US Abdomen Limited  Result Date: 06/07/2022 CLINICAL DATA:  Lower abdominal pain and nausea. EXAM: ULTRASOUND ABDOMEN LIMITED RIGHT UPPER QUADRANT COMPARISON:  CT 03/07/2016 FINDINGS: Gallbladder: Multiple stones and sludge filled gallbladder. No gallbladder wall thickening. Murphy's sign is negative. Common bile duct: Diameter: 5 mm, normal Liver: No focal lesion identified. Within normal limits in parenchymal echogenicity. Portal vein is patent on color Doppler imaging with normal direction of blood flow towards the liver. Other: None. IMPRESSION: Contracted gallbladder filled with sludge and stones. No additional changes to suggest acute cholecystitis. Electronically Signed   By: Lucienne Capers M.D.   On: 06/07/2022 00:59    Procedures Procedures    Medications Ordered in ED Medications  dicyclomine (BENTYL) injection 20 mg (20 mg Intramuscular Patient Refused/Not Given 06/07/22 0118)  ketorolac (TORADOL) 30 MG/ML injection 30 mg (30 mg Intravenous Given 06/07/22 0119)  ondansetron (ZOFRAN) injection 4 mg (4 mg Intravenous Given 06/07/22 0118)    ED Course/ Medical Decision Making/ A&P                           Medical Decision Making Abdominal pain since 530   Amount and/or Complexity of Data Reviewed External Data Reviewed: notes.    Details: previous notes reivewed Labs: ordered.    Details: All labs reviewed:  Normal white count without infection hemoglobin slightly low 11.4 normal platelets.  Normal sodium and potassium and creatinine  elevated glucose 124 Normal bilirubin, elevated AST 393 and ALT 157.  . Radiology: ordered and independent interpretation performed.    Details: Stones in gall bladder by  me  Risk Prescription drug management. Risk Details: Given elevation in AST and ALT ultrasound was ordered even though patient was pain free without medication.  Stones present but no acute cholecystitis.  Medication was ordered and offered to the patient but patient declines all medication as she is pain free.  EDP stated LFT needed to be rechecked in one week by PMD or her GI specialist.  Also follow up as an outpatient with surgery to discuss elective removal of the gall bladder.  Strict return.      Final Clinical Impression(s) / ED Diagnoses Final diagnoses:  Abdominal pain, unspecified  abdominal location  Elevated liver function tests   Return for intractable cough, coughing up blood, fevers > 100.4 unrelieved by medication, shortness of breath, intractable vomiting, chest pain, shortness of breath, weakness, numbness, changes in speech, facial asymmetry, abdominal pain, passing out, Inability to tolerate liquids or food, cough, altered mental status or any concerns. No signs of systemic illness or infection. The patient is nontoxic-appearing on exam and vital signs are within normal limits.  I have reviewed the triage vital signs and the nursing notes. Pertinent labs & imaging results that were available during my care of the patient were reviewed by me and considered in my medical decision making (see chart for details). After history, exam, and medical workup I feel the patient has been appropriately medically screened and is safe for discharge home. Pertinent diagnoses were discussed with the patient. Patient was given return precautions.  Rx / DC Orders ED Discharge Orders          Ordered    dicyclomine (BENTYL) 20 MG tablet  2 times daily        06/07/22 0135    ondansetron (ZOFRAN-ODT) 8 MG disintegrating tablet        06/07/22 0135              Betzayda Braxton, MD 06/07/22 0201

## 2022-06-07 NOTE — ED Notes (Signed)
Pt refusing meds, states she feels much better and is requesting dc paperwork to go home.

## 2022-10-05 ENCOUNTER — Telehealth: Payer: Self-pay

## 2022-10-05 NOTE — Telephone Encounter (Signed)
Nurse Assessment Nurse: Thad Ranger RN, Denise Date/Time (Eastern Time): 10/05/2022 8:42:49 AM Confirm and document reason for call. If symptomatic, describe symptoms. ---Pt has seen white worms in her stool. She has intermittent abd pain, n/v. She only has slight abd pain today w/o n/v. Does the patient have any new or worsening symptoms? ---Yes Will a triage be completed? ---Yes Related visit to physician within the last 2 weeks? ---No Does the PT have any chronic conditions? (i.e. diabetes, asthma, this includes High risk factors for pregnancy, etc.) ---No Is the patient pregnant or possibly pregnant? (Ask all females between the ages of 33-55) ---No Is this a behavioral health or substance abuse call? ---No Guidelines Guideline Title Affirmed Question Affirmed Notes Nurse Date/Time (Eastern Time) Pinworms Pinworms seen (white thread-like worm about size of a staple, moves) Carmon, RN, Denise 10/05/2022 8:45:34 AM Disp. Time Eilene Ghazi Time) Disposition Final User 10/05/2022 8:22:22 AM Attempt made - message left Carmon, RN, Langley Gauss 10/05/2022 8:48:14 AM See PCP within 24 Hours Yes Carmon, RN, Langley Gauss PLEASE NOTE: All timestamps contained within this report are represented as [place] Standard Time. CONFIDENTIALTY NOTICE: This fax transmission is intended only for the addressee. It contains information that is legally privileged, confidential or otherwise protected from use or disclosure. If you are not the intended recipient, you are strictly prohibited from reviewing, disclosing, copying using or disseminating any of this information or taking any action in reliance on or regarding this information. If you have received this fax in error, please notify us immediately by telephone so that we can arrange for its return to [place]. Phone: [phone], Toll-Free: [phone], Fax: [phone] Page: 2 of 2 Call Id: [id number] Final Disposition 10/05/2022 8:48:14 AM See PCP within 24 Hours  Yes Carmon, RN, Langley Gauss Disposition Overriden: Home Care Override Reason: Patient's symptoms need a higher level of care Caller Disagree/Comply Comply Caller Understands Yes PreDisposition Call Doctor Care Advice Given Per Guideline SEE PCP WITHIN 24 HOURS: CALL BACK IF: * You become worse CARE ADVICE given per Pinworms (Adult) guideline. Comments User: Romeo Apple, RN Date/Time Eilene Ghazi Time): 10/05/2022 8:47:53 AM Advised to try to get a stool sample w/worms in it to take to MD. Keep refrig until seen. Referrals REFERRED TO PCP OFFICE

## 2022-10-05 NOTE — Telephone Encounter (Signed)
Appt w/ Lovena Le tomorrow.

## 2022-10-06 ENCOUNTER — Telehealth (INDEPENDENT_AMBULATORY_CARE_PROVIDER_SITE_OTHER): Payer: Self-pay | Admitting: Family Medicine

## 2022-10-06 ENCOUNTER — Encounter: Payer: Self-pay | Admitting: Family Medicine

## 2022-10-06 DIAGNOSIS — R195 Other fecal abnormalities: Secondary | ICD-10-CM

## 2022-10-06 NOTE — Progress Notes (Signed)
Virtual Video Visit via MyChart Note converted to telephone   I connected with  [name] [name] on 10/06/22 at  8:40 AM EST by the video enabled telemedicine application for MyChart, and verified that I am speaking with the correct person using two identifiers.   I introduced myself as a Designer, jewellery with the practice. We discussed the limitations of evaluation and management by telemedicine and the availability of in person appointments. The patient expressed understanding and agreed to proceed.  Participating parties in this visit include: The patient and the nurse practitioner listed.  The patient is: At home I am: In the office - [facility]:    CC:  Chief Complaint  Patient presents with   worms    Thin and flat white, about a month she seen them.     HPI: [name] [name] is a 41 y.o. year old female presenting today via [place] today for worms in stool.  Patient reports that this week she has noticed some small (1-1.5 inches long, white worms in her stool. States she saw something similar about a month ago, but was in a public restroom at the time so didn't think about being from herself. She did have some nausea and vomiting about a week ago, but reports she though it was from constipation (chronic issue for her) so she took a laxative and then she started noticing the worms over the next few days. Yesterday she did have some abdominal discomfort, but otherwise she denies any pain, ongoing nausea/vomiting, blood in stool, diarrhea, weight loss, fevers, etc.    Past medical history, Surgical history, Family history not pertinant except as noted below, Social history, Allergies, and medications have been entered into the medical record, reviewed, and corrections made.   Review of Systems:  All review of systems negative except what is listed in the HPI   Objective:    General:  Speaking clearly in complete sentences. Absent  shortness of breath noted.   Alert and oriented x3.   Normal judgment.  Absent acute distress.   Impression and Recommendations:    1. Change in stool Discussed with PCP and we recommend starting OTC pinworm management - education attached to AVS. Monitor for any new or worsening symptoms and follow-up if not improving.  Patient aware of signs/symptoms requiring further/urgent evaluation.     Follow-up if symptoms worsen or fail to improve.    I discussed the assessment and treatment plan with the patient. The patient was provided an opportunity to ask questions and all were answered. The patient agreed with the plan and demonstrated an understanding of the instructions.   The patient was advised to call back or seek an in-person evaluation if the symptoms worsen or if the condition fails to improve as anticipated.  I spent 20 minutes dedicated to the care of this patient on the date of this encounter to include pre-visit chart review of prior notes and results, face-to-face time with the patient, and post-visit ordering of testing as indicated.   Terrilyn Saver, NP

## 2022-10-06 NOTE — Patient Instructions (Addendum)
Discussed with Dr. Nani Ravens Recommend starting with OTC treatment and supportive measures (Pin-X, Reese's Pinworm, etc).  Monitor for any new or worsening symptoms and follow-up if needed

## 2023-06-29 ENCOUNTER — Other Ambulatory Visit: Payer: Self-pay

## 2023-06-29 ENCOUNTER — Encounter (HOSPITAL_BASED_OUTPATIENT_CLINIC_OR_DEPARTMENT_OTHER): Payer: Self-pay

## 2023-06-29 ENCOUNTER — Emergency Department (HOSPITAL_BASED_OUTPATIENT_CLINIC_OR_DEPARTMENT_OTHER)
Admission: EM | Admit: 2023-06-29 | Discharge: 2023-06-29 | Disposition: A | Discharge status: Home / Self Care | Payer: Self-pay | Attending: Emergency Medicine | Admitting: Emergency Medicine

## 2023-06-29 DIAGNOSIS — K0889 Other specified disorders of teeth and supporting structures: Secondary | ICD-10-CM | POA: Insufficient documentation

## 2023-06-29 DIAGNOSIS — N760 Acute vaginitis: Secondary | ICD-10-CM | POA: Insufficient documentation

## 2023-06-29 DIAGNOSIS — B9689 Other specified bacterial agents as the cause of diseases classified elsewhere: Secondary | ICD-10-CM | POA: Insufficient documentation

## 2023-06-29 LAB — URINALYSIS, ROUTINE W REFLEX MICROSCOPIC
Bilirubin Urine: NEGATIVE
Glucose, UA: NEGATIVE mg/dL
Hgb urine dipstick: NEGATIVE
Ketones, ur: NEGATIVE mg/dL
Leukocytes,Ua: NEGATIVE
Nitrite: NEGATIVE
Protein, ur: NEGATIVE mg/dL
Specific Gravity, Urine: <=1.005 (ref 1.005–1.030)
pH: 6.5 (ref 5.0–8.0)

## 2023-06-29 LAB — WET PREP, GENITAL
Sperm: NONE SEEN
Trich, Wet Prep: NONE SEEN
WBC, Wet Prep HPF POC: <10 (ref <10)
Yeast Wet Prep HPF POC: NONE SEEN

## 2023-06-29 LAB — PREGNANCY, URINE: Preg Test, Ur: NEGATIVE

## 2023-06-29 MED ORDER — AMOXICILLIN 500 MG PO CAPS: 500.0000 mg | ORAL_CAPSULE | Freq: Three times a day (TID) | ORAL | 0 refills | Status: DC

## 2023-06-29 MED ORDER — METRONIDAZOLE 500 MG PO TABS: 500.0000 mg | ORAL_TABLET | Freq: Two times a day (BID) | ORAL | 0 refills | Status: DC

## 2023-06-29 NOTE — ED Triage Notes (Signed)
Patient presents to ED via POV from home. Here with left sided dental pain. Also expresses concern for UTI or yeast infection.

## 2023-06-29 NOTE — ED Notes (Signed)
RN provided AVS using Teachback Method. Patient verbalizes understanding of Discharge Instructions. Opportunity for Questioning and Answers were provided by RN. Patient Discharged from ED ambulatory to Home via Self.  

## 2023-06-29 NOTE — ED Provider Notes (Signed)
EMERGENCY DEPARTMENT AT [facility] Provider Note   CSN: [id number] Arrival date & time: 06/29/23  1053     History  Chief Complaint  Patient presents with   Dental Pain    [name] [name] is a 42 y.o. female.  Patient here with left-sided dental pain and some facial swelling, concern for some dysuria, may be some vaginal discharge.  No known obvious STD exposure.  Nothing makes it worse or better.  Not having any abdominal pain.  Denies any chest pain shortness of breath numbness weakness.  No difficulty eating drinking.  No drooling.  No recent antibiotics.  History of BV.  The history is provided by the patient.       Home Medications Prior to Admission medications   Medication Sig Start Date End Date Taking? Authorizing Provider  amoxicillin (AMOXIL) 500 MG capsule Take 1 capsule (500 mg total) by mouth 3 (three) times daily. 06/29/23  Yes Nyzaiah Kai, DO  metroNIDAZOLE (FLAGYL) 500 MG tablet Take 1 tablet (500 mg total) by mouth 2 (two) times daily. 06/29/23  Yes Onedia Vargus, DO  cholecalciferol (VITAMIN D) 1000 UNITS tablet Take 4,000 Units by mouth daily.    [provider]  OVER THE COUNTER MEDICATION Take 1 scoop by mouth daily. Powdered multivitamin (adds to food)    [provider]      Allergies    Percocet [oxycodone-acetaminophen]    Review of Systems   Review of Systems  Physical Exam Updated Vital Signs BP (!) 130/92   Pulse 86   Temp 98.6 F (37 C) (Oral)   Resp 16   Ht 5\' 5"  (1.651 m)   Wt 86.2 kg   SpO2 100%   BMI 31.62 kg/m  Physical Exam Vitals and nursing note reviewed.  Constitutional:      General: She is not in acute distress.    Appearance: She is well-developed. She is not ill-appearing.  HENT:     Head: Normocephalic and atraumatic.     Comments: Mild facial swelling to the left lower jaw line but no submandibular swelling, no trismus or drooling, dental carry at the left lower mouth     Nose: Nose normal.     Mouth/Throat:     Mouth: Mucous membranes are moist.  Eyes:     Extraocular Movements: Extraocular movements intact.     Conjunctiva/sclera: Conjunctivae normal.     Pupils: Pupils are equal, round, and reactive to light.  Cardiovascular:     Rate and Rhythm: Normal rate and regular rhythm.  Pulmonary:     Effort: Pulmonary effort is normal.  Abdominal:     Palpations: Abdomen is soft.     Tenderness: There is no abdominal tenderness.  Musculoskeletal:     Cervical back: Normal range of motion and neck supple.  Skin:    General: Skin is warm and dry.     Capillary Refill: Capillary refill takes less than 2 seconds.  Neurological:     Mental Status: She is alert.  Psychiatric:        Mood and Affect: Mood normal.     ED Results / Procedures / Treatments   Labs (all labs ordered are listed, but only abnormal results are displayed) Labs Reviewed  WET PREP, GENITAL - Abnormal; Notable for the following components:      Result Value   Clue Cells Wet Prep HPF POC PRESENT (*)    All other components within normal limits  URINALYSIS, ROUTINE  W REFLEX MICROSCOPIC  PREGNANCY, URINE  GC/CHLAMYDIA PROBE AMP ([place]) NOT AT [facility]    EKG None  Radiology No results found.  Procedures Procedures    Medications Ordered in ED Medications - No data to display  ED Course/ Medical Decision Making/ A&P                                 Medical Decision Making Amount and/or Complexity of Data Reviewed Labs: ordered.  Risk Prescription drug management.  [name] is here with dental pain and swelling and pain with urination.  On dental exam she has a dental carry on the left lower mouth with little bit of facial swelling around the mandible.  No submandibular swelling.  No trismus or drooling.  Very localized likely tooth infection.  Will treat with antibiotics.  She is having some UTI symptoms as well.  Maybe some discharge.  Have no concern for  PID.  She has no fever pain.  Will have her self swab for gonorrhea, chlamydia, yeast infection, bacterial vaginosis.  Will get urinalysis and pregnancy test as well.  Wet prep per my review interpretation is positive for bacterial vaginosis.  Pregnancy test negative.  UTI negative for infection.  Will treat bacterial vaginosis with Flagyl.  Will treat dental infection with amoxicillin.  Will have her follow-up with dentistry.  She understands return precautions.  Discharged in good condition.  This chart was dictated using voice recognition software.  Despite best efforts to proofread,  errors can occur which can change the documentation meaning.         Final Clinical Impression(s) / ED Diagnoses Final diagnoses:  Pain, dental  BV (bacterial vaginosis)    Rx / DC Orders ED Discharge Orders          Ordered    metroNIDAZOLE (FLAGYL) 500 MG tablet  2 times daily        06/29/23 1146    amoxicillin (AMOXIL) 500 MG capsule  3 times daily        06/29/23 1146              [place], Madelaine Bhat, DO 06/29/23 1146

## 2023-07-02 LAB — GC/CHLAMYDIA PROBE AMP (~~LOC~~) NOT AT ARMC
Chlamydia: NEGATIVE
Comment: NEGATIVE
Comment: NORMAL
Neisseria Gonorrhea: NEGATIVE

## 2023-08-19 ENCOUNTER — Emergency Department (HOSPITAL_BASED_OUTPATIENT_CLINIC_OR_DEPARTMENT_OTHER)
Admission: EM | Admit: 2023-08-19 | Discharge: 2023-08-19 | Disposition: A | Discharge status: Home / Self Care | Payer: Self-pay | Attending: Emergency Medicine | Admitting: Emergency Medicine

## 2023-08-19 ENCOUNTER — Other Ambulatory Visit: Payer: Self-pay

## 2023-08-19 ENCOUNTER — Encounter (HOSPITAL_BASED_OUTPATIENT_CLINIC_OR_DEPARTMENT_OTHER): Payer: Self-pay

## 2023-08-19 DIAGNOSIS — N898 Other specified noninflammatory disorders of vagina: Secondary | ICD-10-CM | POA: Insufficient documentation

## 2023-08-19 LAB — URINALYSIS, W/ REFLEX TO CULTURE (INFECTION SUSPECTED)
Bilirubin Urine: NEGATIVE
Glucose, UA: NEGATIVE mg/dL
Hgb urine dipstick: NEGATIVE
Ketones, ur: NEGATIVE mg/dL
Leukocytes,Ua: NEGATIVE
Nitrite: NEGATIVE
Protein, ur: NEGATIVE mg/dL
RBC / HPF: NONE SEEN RBC/hpf (ref 0–5)
Specific Gravity, Urine: 1.025 (ref 1.005–1.030)
pH: 7 (ref 5.0–8.0)

## 2023-08-19 LAB — WET PREP, GENITAL
Clue Cells Wet Prep HPF POC: NONE SEEN
Sperm: NONE SEEN
Trich, Wet Prep: NONE SEEN
WBC, Wet Prep HPF POC: <10 (ref <10)
Yeast Wet Prep HPF POC: NONE SEEN

## 2023-08-19 LAB — PREGNANCY, URINE: Preg Test, Ur: NEGATIVE

## 2023-08-19 NOTE — ED Provider Notes (Signed)
[place] [facility] Provider Note   CSN: [id number] Arrival date & time: 08/19/23  3474     History  Chief Complaint  Patient presents with   Vaginal Pain    [name] [name] is a 42 y.o. female.  Patient is a 42 year old female who presents with some vaginal pain.  She says over the last 2 days she has had some bumps in discomfort around her external vaginal area.  She denies any vaginal discharge.  She says she did have some vaginal discharge about a month ago and was seen here and diagnosed with bacterial vaginosis.  She completed a course of Flagyl and says the discharge has resolved.  She denies any prior history of STDs.  She has not been sexually active in about 2 years.  She did have some burning pain similar to what she is having now about 2 years ago but at that time was negative for STDs.  No abdominal pain.  No vomiting or fevers.       Home Medications Prior to Admission medications   Medication Sig Start Date End Date Taking? Authorizing Provider  amoxicillin (AMOXIL) 500 MG capsule Take 1 capsule (500 mg total) by mouth 3 (three) times daily. 06/29/23   Curatolo, Adam, DO  cholecalciferol (VITAMIN D) 1000 UNITS tablet Take 4,000 Units by mouth daily.    [provider]  metroNIDAZOLE (FLAGYL) 500 MG tablet Take 1 tablet (500 mg total) by mouth 2 (two) times daily. 06/29/23   Curatolo, Adam, DO  OVER THE COUNTER MEDICATION Take 1 scoop by mouth daily. Powdered multivitamin (adds to food)    [provider]      Allergies    Percocet [oxycodone-acetaminophen]    Review of Systems   Review of Systems  Constitutional:  Negative for chills, diaphoresis, fatigue and fever.  HENT:  Negative for congestion, rhinorrhea and sneezing.   Eyes: Negative.   Respiratory:  Negative for cough, chest tightness and shortness of breath.   Cardiovascular:  Negative for chest pain and leg swelling.  Gastrointestinal:  Negative  for abdominal pain, blood in stool, diarrhea, nausea and vomiting.  Genitourinary:  Positive for vaginal pain. Negative for difficulty urinating, flank pain, frequency, hematuria, vaginal bleeding and vaginal discharge.  Musculoskeletal:  Negative for arthralgias and back pain.  Skin:  Negative for rash.  Neurological:  Negative for dizziness, speech difficulty, weakness, numbness and headaches.    Physical Exam Updated Vital Signs BP (!) 130/94   Pulse 88   Temp 98.3 F (36.8 C) (Oral)   Resp 16   Ht 5\' 5"  (1.651 m)   Wt 86 kg   LMP 08/09/2023 (Exact Date)   SpO2 100%   BMI 31.55 kg/m  Physical Exam Constitutional:      Appearance: She is well-developed.  HENT:     Head: Normocephalic and atraumatic.  Eyes:     Pupils: Pupils are equal, round, and reactive to light.  Cardiovascular:     Rate and Rhythm: Normal rate and regular rhythm.     Heart sounds: Normal heart sounds.  Pulmonary:     Effort: Pulmonary effort is normal. No respiratory distress.     Breath sounds: Normal breath sounds. No wheezing or rales.  Chest:     Chest wall: No tenderness.  Abdominal:     General: Bowel sounds are normal.     Palpations: Abdomen is soft.     Tenderness: There is no abdominal tenderness. There  is no guarding or rebound.  Genitourinary:    Comments: Moderate thin white discharge, no cervical motion tenderness, no adnexal tenderness, there is some skin irritation to her inner thighs just outside the introitus.  There is some small bumps but no definite blisters.  No other signs of infection. Musculoskeletal:        General: Normal range of motion.     Cervical back: Normal range of motion and neck supple.  Lymphadenopathy:     Cervical: No cervical adenopathy.  Skin:    General: Skin is warm and dry.     Findings: No rash.  Neurological:     Mental Status: She is alert and oriented to person, place, and time.     ED Results / Procedures / Treatments   Labs (all labs  ordered are listed, but only abnormal results are displayed) Labs Reviewed  URINALYSIS, W/ REFLEX TO CULTURE (INFECTION SUSPECTED) - Abnormal; Notable for the following components:      Result Value   Bacteria, UA MANY (*)    All other components within normal limits  WET PREP, GENITAL  PREGNANCY, URINE  HSV 1/2 PCR (SURFACE)  GC/CHLAMYDIA PROBE AMP ([facility]) NOT AT [facility]    EKG None  Radiology No results found.  Procedures Procedures    Medications Ordered in ED Medications - No data to display  ED Course/ Medical Decision Making/ A&P                                 Medical Decision Making Amount and/or Complexity of Data Reviewed Labs: ordered.   Patient is a 42 year old female who presents with some irritation of the skin around her vaginal area.  She is concerned about herpes infection.  I do not see any lesions that are consistent with herpetic lesions.  It looks like she has some scaly irritated skin around the vaginal area but no overlying signs of infection.  No distinct blistering.  However we did obtain a swab of the area for HSV.  This is pending.  She did have some vaginal discharge.  She states that she has not been sexually active for 2 years.  Wet prep is negative.  GC and committee are pending.  However given her reports of no sexual activity, she was not started presumptively on treatment for STDs.  Otherwise she could just have a little bit of irritation in the skin around her introitus.  She was discharged home in good condition.  She was given referral to follow-up with the women's outpatient clinic.  Return precautions were given.  Final Clinical Impression(s) / ED Diagnoses Final diagnoses:  Vaginal irritation    Rx / DC Orders ED Discharge Orders     None         Rolan Bucco, MD 08/19/23 1151

## 2023-08-19 NOTE — ED Triage Notes (Signed)
The patient was given medication for BV one month ago. She stated she is still having vaginal pain. She stated she has not had intercourse for 2 years. She is having bumps on the outside of her vagina.

## 2023-08-19 NOTE — Discharge Instructions (Signed)
Follow-up with the women's outpatient clinic if your symptoms are not improving.  Return to the emergency room if you have any worsening symptoms.

## 2023-08-20 LAB — HSV 1/2 PCR (SURFACE)
HSV-1 DNA: NOT DETECTED
HSV-2 DNA: NOT DETECTED

## 2023-08-20 LAB — GC/CHLAMYDIA PROBE AMP (~~LOC~~) NOT AT ARMC
Chlamydia: NEGATIVE
Comment: NEGATIVE
Comment: NORMAL
Neisseria Gonorrhea: NEGATIVE

## 2024-03-14 ENCOUNTER — Ambulatory Visit (INDEPENDENT_AMBULATORY_CARE_PROVIDER_SITE_OTHER): Admitting: Family Medicine

## 2024-03-14 ENCOUNTER — Ambulatory Visit: Payer: Self-pay

## 2024-03-14 ENCOUNTER — Encounter: Payer: Self-pay | Admitting: Family Medicine

## 2024-03-14 VITALS — BP 113/78 | HR 90 | Temp 98.5°F | Ht 65.0 in | Wt 182.0 lb

## 2024-03-14 DIAGNOSIS — R7989 Other specified abnormal findings of blood chemistry: Secondary | ICD-10-CM

## 2024-03-14 DIAGNOSIS — R197 Diarrhea, unspecified: Secondary | ICD-10-CM

## 2024-03-14 DIAGNOSIS — R112 Nausea with vomiting, unspecified: Secondary | ICD-10-CM

## 2024-03-14 LAB — CBC WITH DIFFERENTIAL/PLATELET
Basophils Absolute: 0 10*3/uL (ref 0.0–0.1)
Basophils Relative: 0.3 % (ref 0.0–3.0)
Eosinophils Absolute: 0.1 10*3/uL (ref 0.0–0.7)
Eosinophils Relative: 0.4 % (ref 0.0–5.0)
HCT: 38.8 % (ref 36.0–46.0)
Hemoglobin: 12.5 g/dL (ref 12.0–15.0)
Lymphocytes Relative: 6.3 % — ABNORMAL LOW (ref 12.0–46.0)
Lymphs Abs: 0.8 10*3/uL (ref 0.7–4.0)
MCHC: 32.1 g/dL (ref 30.0–36.0)
MCV: 81.4 fl (ref 78.0–100.0)
Monocytes Absolute: 0.9 10*3/uL (ref 0.1–1.0)
Monocytes Relative: 7.5 % (ref 3.0–12.0)
Neutro Abs: 10.7 10*3/uL — ABNORMAL HIGH (ref 1.4–7.7)
Neutrophils Relative %: 85.5 % — ABNORMAL HIGH (ref 43.0–77.0)
Platelets: 394 10*3/uL (ref 150.0–400.0)
RBC: 4.77 Mil/uL (ref 3.87–5.11)
RDW: 14.6 % (ref 11.5–15.5)
WBC: 12.5 10*3/uL — ABNORMAL HIGH (ref 4.0–10.5)

## 2024-03-14 LAB — BASIC METABOLIC PANEL WITH GFR
BUN: 8 mg/dL (ref 6–23)
CO2: 27 meq/L (ref 19–32)
Calcium: 9.2 mg/dL (ref 8.4–10.5)
Chloride: 102 meq/L (ref 96–112)
Creatinine, Ser: 0.89 mg/dL (ref 0.40–1.20)
GFR: 80.16 mL/min (ref >60.00)
Glucose, Bld: 88 mg/dL (ref 70–99)
Potassium: 4.2 meq/L (ref 3.5–5.1)
Sodium: 138 meq/L (ref 135–145)

## 2024-03-14 MED ORDER — ONDANSETRON 4 MG PO TBDP: 4.0000 mg | ORAL_TABLET | Freq: Three times a day (TID) | ORAL | 1 refills | Status: AC | PRN

## 2024-03-14 NOTE — Telephone Encounter (Signed)
 This patient had an appointment today at 1240 scheduled before patient could be triaged, will not attempt to call patient due to this, routing to the office.   Copied from CRM [id number]. Topic: Clinical - Medical Advice >> Mar 14, 2024 11:26 AM Alyse July wrote: Reason for CRM: Patient states she believes she has food poisoning and would like to know if she can have her stomach pump, if what she is experiencing is bacterial or viral and whether an antibiotic can be prescribed. Please contact patient to advise. [phone]

## 2024-03-14 NOTE — Telephone Encounter (Signed)
 Patient seen today, message closed.

## 2024-03-14 NOTE — Progress Notes (Signed)
 Acute Office Visit  Subjective:     Patient ID: [name] [name], female    DOB: [birth year], 43 y.o.   MRN: [id number]  Chief Complaint  Patient presents with   Vomiting    HPI Patient is in today for GI symptoms.   Discussed the use of AI scribe software for clinical note transcription with the patient, who gave verbal consent to proceed.  History of Present Illness [name] [name] is a 43 year old female who presents with acute onset of vomiting and diarrhea.  She felt well yesterday but awoke this morning with stomach pain, vomiting, and diarrhea. The stomach pain is described as crampy and turning. There is concern about dehydration due to the severity of her symptoms.  She has a history of stomach issues, which were exacerbated by a previous episode of food poisoning during college that lasted almost two months. This past experience has made her anxious about her current symptoms.  She ate chicken nachos at [facility] and noted that the last bite tasted unusual, prompting her to stop eating. She has not been around anyone sick recently and has not used antibiotics recently.  She has not taken any medication yet for her symptoms but has considered over-the-counter options like Pepcid and Imodium. She is unsure if she has taken nausea medication like Zofran  before.  No fever, chills, body aches, or blood in stool or vomit. She feels cold, which she attributes to being often anemic.      ROS All review of systems negative except what is listed in the HPI      Objective:    BP 113/78   Pulse 90   Temp 98.5 F (36.9 C)   Ht 5\' 5"  (1.651 m)   Wt 182 lb (82.6 kg)   SpO2 100%   BMI 30.29 kg/m    Physical Exam Vitals reviewed.  Constitutional:      Appearance: Normal appearance.  Cardiovascular:     Rate and Rhythm: Normal rate and regular rhythm.     Heart sounds: Normal heart sounds.  Pulmonary:     Effort: Pulmonary effort is normal.     Breath  sounds: Normal breath sounds.  Abdominal:     General: Bowel sounds are normal. There is no distension.     Palpations: There is no mass.     Tenderness: There is no guarding or rebound.     Hernia: No hernia is present.     Comments: Mild generalized tenderness, no guarding   Skin:    General: Skin is warm and dry.  Neurological:     Mental Status: She is alert and oriented to person, place, and time.  Psychiatric:        Mood and Affect: Mood normal.        Behavior: Behavior normal.        Thought Content: Thought content normal.        Judgment: Judgment normal.        No results found for any visits on 03/14/24.      Assessment & Plan:   Problem List Items Addressed This Visit   None Visit Diagnoses       Nausea vomiting and diarrhea    -  Primary   Relevant Medications   ondansetron  (ZOFRAN -ODT) 4 MG disintegrating tablet   Other Relevant Orders   Stool culture   CBC with Differential/Platelet   Basic metabolic panel with GFR   Clostridium Difficile by PCR  Assessment & Plan Gastroenteritis Acute gastroenteritis with nausea, vomiting, diarrhea, and abdominal cramping post-consumption of chicken nachos. Risk of dehydration due to fluid loss. Hospitalization for IV fluids may be necessary if symptoms persist or worsen. - Prescribed Zofran  for nausea and vomiting. - Advised Pepto-Bismol or Imodium for diarrhea, ensuring no fever is present. Informed that Pepto-Bismol can turn stool black. - Ordered blood work to assess dehydration and infection. - Provided stool kit for testing if diarrhea persists over the weekend, with instructions to return the sample by Monday. - Advised supportive care with hydration using electrolyte solutions like Gatorade or Pedialyte, and consumption of bland foods such as saltine crackers and applesauce. - Instructed to seek emergency care if severe dehydration symptoms occur, such as dry mouth, decreased urination, dizziness, or  lightheadedness. - Advised to monitor for blood in stool or vomit and seek immediate care if present.    Meds ordered this encounter  Medications   ondansetron  (ZOFRAN -ODT) 4 MG disintegrating tablet    Sig: Take 1 tablet (4 mg total) by mouth every 8 (eight) hours as needed for nausea or vomiting.    Dispense:  20 tablet    Refill:  1    Supervising Provider:   Randie Bustle A [[id number]]    Return if symptoms worsen or fail to improve.  Everlina Hock, NP

## 2024-03-17 ENCOUNTER — Encounter: Payer: Self-pay | Admitting: Family Medicine

## 2024-03-17 NOTE — Addendum Note (Signed)
 Addended by: Minna Amass B on: 03/17/2024 08:10 AM   Modules accepted: Orders

## 2024-03-20 ENCOUNTER — Other Ambulatory Visit

## 2024-05-28 ENCOUNTER — Other Ambulatory Visit: Payer: Self-pay

## 2024-05-28 ENCOUNTER — Encounter (HOSPITAL_BASED_OUTPATIENT_CLINIC_OR_DEPARTMENT_OTHER): Payer: Self-pay

## 2024-05-28 DIAGNOSIS — Y990 Civilian activity done for income or pay: Secondary | ICD-10-CM | POA: Insufficient documentation

## 2024-05-28 DIAGNOSIS — R519 Headache, unspecified: Secondary | ICD-10-CM | POA: Diagnosis present

## 2024-05-28 DIAGNOSIS — W540XXA Bitten by dog, initial encounter: Secondary | ICD-10-CM | POA: Diagnosis not present

## 2024-05-28 NOTE — ED Triage Notes (Addendum)
 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  States it bit her left arm (at elbow) There is not a bite mark or broken skin.  Pt states it feels bruised Left arm has good ROM

## 2024-05-29 ENCOUNTER — Emergency Department (HOSPITAL_BASED_OUTPATIENT_CLINIC_OR_DEPARTMENT_OTHER)
Admission: EM | Admit: 2024-05-29 | Discharge: 2024-05-29 | Disposition: A | Discharge status: Home / Self Care | Payer: Worker's Compensation | Attending: Emergency Medicine | Admitting: Emergency Medicine

## 2024-05-29 DIAGNOSIS — Y990 Civilian activity done for income or pay: Secondary | ICD-10-CM

## 2024-05-29 MED ORDER — IBUPROFEN 800 MG PO TABS
800.0000 mg | ORAL_TABLET | Freq: Once | ORAL | Status: AC
  Administered 2024-05-29: 800 mg via ORAL
  Filled 2024-05-29: qty 1

## 2024-05-29 NOTE — ED Notes (Signed)
Called 3x with no response. 

## 2024-05-29 NOTE — ED Provider Notes (Signed)
  [place] [facility] Provider Note   CSN: [id number] Arrival date & time: 05/28/24  2132     Patient presents with: animal bite   [name] [name] is a 43 y.o. female.   43 year old female presents for evaluation after an encounter with a dog today.  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  The encounter did not break the skin, there is no injury to the skin as a result of this encounter.  She states that this time she has a headache from the tear of everything today.  A report was filed with the sheriff's department.       Prior to Admission medications   Medication Sig Start Date End Date Taking? Authorizing Provider  ondansetron  (ZOFRAN -ODT) 4 MG disintegrating tablet Take 1 tablet (4 mg total) by mouth every 8 (eight) hours as needed for nausea or vomiting. 03/14/24   Almarie Waddell NOVAK, NP    Allergies: Percocet [oxycodone-acetaminophen ]    Review of Systems Negative except as per HPI Updated Vital Signs BP (!) 128/99 (BP Location: Left Arm)   Pulse 72   Temp 98 F (36.7 C) (Oral)   Resp 16   Ht 5' 5 (1.651 m)   Wt 86.2 kg   LMP 05/18/2024 (Exact Date)   SpO2 100%   BMI 31.62 kg/m   Physical Exam Vitals and nursing note reviewed.  Constitutional:      General: She is not in acute distress.    Appearance: She is well-developed. She is not diaphoretic.  HENT:     Head: Normocephalic and atraumatic.  Cardiovascular:     Pulses: Normal pulses.  Pulmonary:     Effort: Pulmonary effort is normal.  Musculoskeletal:        General: No swelling, tenderness or signs of injury. Normal range of motion.  Skin:    General: Skin is warm and dry.     Findings: No bruising, erythema or rash.  Neurological:     Mental Status: She is alert and oriented to person, place, and time.  Psychiatric:        Behavior: Behavior normal.     (all labs ordered are listed, but only abnormal results are  displayed) Labs Reviewed - No data to display  EKG: None  Radiology: No results found.   Procedures   Medications Ordered in the ED - No data to display                                  Medical Decision Making  43 year old female presents with complaint as above.  Regarding a dog bite to the arm, I have thoroughly examined the arm, there are no puncture wounds, no visible trauma to the area, she has normal range of motion of the elbow joint.  The compartments are soft.  Distal perfusion appears intact, motor intact.  Patient is provided with Motrin  for her headache.  Recommend recheck with her Worker's Comp. provider in 2 days to recheck for any development of injury in the arm.     Final diagnoses:  None    ED Discharge Orders     None          [name] 05/29/24 0054    Griselda Norris, MD 05/29/24 0130

## 2024-05-29 NOTE — Discharge Instructions (Signed)
 Recheck with your workers comp provider in 2 days. You can take Motrin  for your arm pain and headache.

## 2024-06-05 ENCOUNTER — Ambulatory Visit: Payer: Self-pay

## 2024-06-05 NOTE — Telephone Encounter (Signed)
 FYI Only or Action Required?: Action required by provider: update on patient condition.  Patient was last seen in primary care on 03/14/2024 by Almarie Waddell NOVAK, NP.  Called Nurse Triage reporting Constipation.  Symptoms began several weeks ago.  Interventions attempted: Nothing.  Symptoms are: gradually worsening.  Triage Disposition: See HCP Within 4 Hours (Or PCP Triage)  Patient/caregiver understands and will follow disposition?: UnsureCopied from CRM #[id number]. Topic: Clinical - Red Word Triage >> Jun 05, 2024  3:29 PM Gennette ORN wrote: Red Word that prompted transfer to Nurse Triage: Patient is constipated she having issues using the bathroom. She is urinating fine but can't make any bowel movements. She tried laxative, fiber and everything still nothing. Also has a lot of severe stomach pains. She has been nausea as well. She had bleeding in the stool about 2 weeks ago. These symptoms have been going on for about 2 weeks now. Reason for Disposition  [1] Rectal pain or fullness from fecal impaction (rectum full of stool) AND [2] NOT better after SITZ bath, suppository or enema  Answer Assessment - Initial Assessment Questions Last BM was 7/20-21.  Pt has chronic constipation. Pt noticed some  brown/red stool during BM. Pt feels it was blood but doesn't  remember if blood on tissue. Not happened since. Pt typically has to take a laxative. Increased ibuprofen  over last time. RN advised UC but pt is going to try prune juice and if not gone within 1-2 hours she will go.     1. STOOL PATTERN OR FREQUENCY: How often do you have a bowel movement (BM)?  (Normal range: 3 times a day to every 3 days)  When was your last BM?       Every other day 2. STRAINING: Do you have to strain to have a BM?      Not usually  3. ONSET: When did the constipation begin?     About 15-16 years 4. RECTAL PAIN: Does your rectum hurt when the stool comes out? If Yes, ask: Do you have hemorrhoids? How  bad is the pain?  (Scale 1-10; or mild, moderate, severe)     Yes, abd pain worst is 8/10 but not currently in pain 5. BM COMPOSITION: Are the stools hard?      Parts are hard 6. BLOOD ON STOOLS: Has there been any blood on the toilet tissue or on the surface of the BM? If Yes, ask: When was the last time?     Possibly  7. CHRONIC CONSTIPATION: Is this a new problem for you?  If No, ask: How long have you had this problem? (days, weeks, months)      yes 8. CHANGES IN DIET OR HYDRATION: Have there been any recent changes in your diet? How much fluids are you drinking on a daily basis?  How much have you had to drink today?     denies 9. MEDICINES: Have you been taking any new medicines? Are you taking any narcotic pain medicines? (e.g., Dilaudid, morphine, Percocet, Vicodin)     denies 10. LAXATIVES: Have you been using any stool softeners, laxatives, or enemas?  If Yes, ask What are you using, how often, and when was the last time?       Senna 11. ACTIVITY:  How much walking do you do every day?  Has your activity level decreased in the past week?        More walking 12. CAUSE: What do you think is causing the constipation?  Low iron 13. MEDICAL HISTORY: Do you have a history of hemorrhoids, rectal fissures, rectal surgery, or rectal abscess?         Internal hemorrhoids 14. OTHER SYMPTOMS: Do you have any other symptoms? (e.g., abdomen pain, bloating, fever, vomiting)       Abd pain; vomiting x1; bloating, nausea.  Protocols used: Constipation-A-AH
